# Patient Record
Sex: Female | Born: 1950 | Race: White | Hispanic: No | Marital: Married | Smoking: Never smoker
Health system: Southern US, Community
[De-identification: ages and names within clinical notes are randomized; demographics above are authoritative.]

## PROBLEM LIST (undated history)

## (undated) DIAGNOSIS — E559 Vitamin D deficiency, unspecified: Secondary | ICD-10-CM

## (undated) DIAGNOSIS — I10 Essential (primary) hypertension: Secondary | ICD-10-CM

## (undated) DIAGNOSIS — G629 Polyneuropathy, unspecified: Secondary | ICD-10-CM

## (undated) DIAGNOSIS — Z9289 Personal history of other medical treatment: Secondary | ICD-10-CM

## (undated) DIAGNOSIS — Z8489 Family history of other specified conditions: Secondary | ICD-10-CM

## (undated) DIAGNOSIS — T148XXA Other injury of unspecified body region, initial encounter: Secondary | ICD-10-CM

## (undated) DIAGNOSIS — Z973 Presence of spectacles and contact lenses: Secondary | ICD-10-CM

## (undated) DIAGNOSIS — E785 Hyperlipidemia, unspecified: Secondary | ICD-10-CM

## (undated) DIAGNOSIS — E119 Type 2 diabetes mellitus without complications: Secondary | ICD-10-CM

## (undated) HISTORY — DX: Vitamin D deficiency, unspecified: E55.9

## (undated) HISTORY — DX: Type 2 diabetes mellitus without complications: E11.9

## (undated) HISTORY — DX: Hyperlipidemia, unspecified: E78.5

## (undated) HISTORY — DX: Polyneuropathy, unspecified: G62.9

## (undated) HISTORY — PX: OTHER SURGICAL HISTORY: SHX169

## (undated) HISTORY — PX: TONSILLECTOMY: SUR1361

---

## 1974-06-05 HISTORY — PX: TUBAL LIGATION: SHX77

## 2011-12-19 ENCOUNTER — Other Ambulatory Visit: Payer: Self-pay | Admitting: Internal Medicine

## 2011-12-19 ENCOUNTER — Encounter: Payer: Self-pay | Admitting: Gastroenterology

## 2011-12-19 ENCOUNTER — Ambulatory Visit (INDEPENDENT_AMBULATORY_CARE_PROVIDER_SITE_OTHER): Payer: BC Managed Care – PPO | Admitting: Gastroenterology

## 2011-12-19 VITALS — BP 150/90 | HR 80 | Temp 97.2°F | Ht 62.0 in | Wt 172.8 lb

## 2011-12-19 DIAGNOSIS — Z1211 Encounter for screening for malignant neoplasm of colon: Secondary | ICD-10-CM

## 2011-12-19 DIAGNOSIS — E119 Type 2 diabetes mellitus without complications: Secondary | ICD-10-CM | POA: Insufficient documentation

## 2011-12-19 MED ORDER — PEG-KCL-NACL-NASULF-NA ASC-C 100 G PO SOLR: 1.0000 | Freq: Once | ORAL | Status: DC

## 2011-12-19 NOTE — Assessment & Plan Note (Addendum)
No prior colonoscopy. No family history of colon cancer or colon polyps her knowledge.  Colonoscopy in the near future with Dr. Gala Romney.  I have discussed the risks, alternatives, benefits with regards to but not limited to the risk of reaction to medication, bleeding, infection, perforation and the patient is agreeable to proceed. Written consent to be obtained.  Day of bowel prep, she will take glipizide one tablet in the morning. Metformin 1 tablet twice a day, Lantus 6 units in the morning. Morning of procedure, do not take any of your diabetes medications.  Patient wants to be completely sedation for the procedure. Does not want to be aware of anything.

## 2011-12-19 NOTE — Patient Instructions (Signed)
Colonoscopy in the near future with Dr. Gala Romney. Please see separate instructions.  Day of bowel prep, she will take glipizide one tablet in the morning. Metformin 1 tablet twice a day, Lantus 6 units in the morning. Morning of procedure, do not take any of your diabetes medications.

## 2011-12-19 NOTE — Progress Notes (Signed)
Faxed to PCP, [name]

## 2011-12-19 NOTE — Progress Notes (Signed)
Primary Care Physician:  [name], DO  Primary Gastroenterologist:  Garfield Cornea, MD   Chief Complaint  Patient presents with  . Colonoscopy    HPI:  Sarah Bridges is a 61 y.o. female here To schedule a first ever screening colonoscopy. Patient denies family history of colon cancer or colon polyps. Generally she has no problems with her bowel movements. She has occasional loose stool related to her medications. Takes occasional Imodium with good relief. No melena, brbpr. No n/v. No dysphagia, GERD. No unintentional weight loss.  Current Outpatient Prescriptions  Medication Sig Dispense Refill  . glipiZIDE (GLUCOTROL) 5 MG tablet Take 5 mg by mouth 2 (two) times daily before a meal.       . hydrochlorothiazide (HYDRODIURIL) 25 MG tablet Take 25 mg by mouth daily.       . insulin glargine (LANTUS) 100 UNIT/ML injection Inject 12 Units into the skin daily.       . metFORMIN (GLUCOPHAGE) 500 MG tablet Take 1,000 mg by mouth 2 (two) times daily with a meal.         Allergies as of 12/19/2011  . (No Known Allergies)    Past Medical History  Diagnosis Date  . DM (diabetes mellitus)   . Hyperlipidemia   . Peripheral neuropathy   . Vitamin d deficiency     Past Surgical History  Procedure Date  . Tonsillectomy   . Tubal ligation 1976  . Piloninal cyst     age 79    Family History  Problem Relation Age of Onset  . Emphysema Mother     deceased  . Liver disease Neg Hx   . Colon cancer Neg Hx     History   Social History  . Marital Status: Married    Spouse Name: N/A    Number of Children: 2  . Years of Education: N/A   Occupational History  . Control and instrumentation engineer    Social History Main Topics  . Smoking status: Never Smoker   . Smokeless tobacco: Not on file  . Alcohol Use: No  . Drug Use: No  . Sexually Active: Not on file   Other Topics Concern  . Not on file   Social History Narrative  . No narrative on file      ROS:  General: Negative for  anorexia, weight loss, fever, chills, fatigue, weakness. Eyes: Negative for vision changes.  ENT: Negative for hoarseness, difficulty swallowing , nasal congestion. CV: Negative for chest pain, angina, palpitations, dyspnea on exertion, peripheral edema.  Respiratory: Negative for dyspnea at rest, dyspnea on exertion, cough, sputum, wheezing.  GI: See history of present illness. GU:  Negative for dysuria, hematuria, urinary incontinence, urinary frequency, nocturnal urination.  MS: Negative for joint pain, low back pain.  Derm: Negative for rash or itching.  Neuro: Negative for weakness, abnormal sensation, seizure, frequent headaches, memory loss, confusion.  Psych: Negative for anxiety, depression, suicidal ideation, hallucinations.  Endo: Negative for unusual weight change.  Heme: Negative for bruising or bleeding. Allergy: Negative for rash or hives.    Physical Examination:  BP 150/90  Pulse 80  Temp 97.2 F (36.2 C) (Temporal)  Ht 5\' 2"  (1.575 m)  Wt 172 lb 12.8 oz (78.382 kg)  BMI 31.61 kg/m2   General: Well-nourished, well-developed in no acute distress.  Head: Normocephalic, atraumatic.   Eyes: Conjunctiva pink, no icterus. Mouth: Oropharyngeal mucosa moist and pink , no lesions erythema or exudate. Neck: Supple without thyromegaly, masses, or lymphadenopathy.  Lungs: Clear to auscultation bilaterally.  Heart: Regular rate and rhythm, no murmurs rubs or gallops.  Abdomen: Bowel sounds are normal, nontender, nondistended, no hepatosplenomegaly or masses, no abdominal bruits or    hernia , no rebound or guarding.   Rectal: deferred to the time of colonoscopy Extremities: No lower extremity edema. No clubbing or deformities.  Neuro: Alert and oriented x 4 , grossly normal neurologically.  Skin: Warm and dry, no rash or jaundice.   Psych: Alert and cooperative, normal mood and affect.

## 2012-01-03 ENCOUNTER — Encounter (HOSPITAL_COMMUNITY): Payer: Self-pay | Admitting: Pharmacy Technician

## 2012-01-17 ENCOUNTER — Encounter (HOSPITAL_COMMUNITY): Payer: Self-pay | Admitting: *Deleted

## 2012-01-17 ENCOUNTER — Ambulatory Visit (HOSPITAL_COMMUNITY)
Admission: RE | Admit: 2012-01-17 | Discharge: 2012-01-17 | Disposition: A | Discharge status: Home / Self Care | Payer: BC Managed Care – PPO | Source: Ambulatory Visit | Attending: Internal Medicine | Admitting: Internal Medicine

## 2012-01-17 ENCOUNTER — Encounter (HOSPITAL_COMMUNITY)
Admission: RE | Disposition: A | Discharge status: Home / Self Care | Payer: Self-pay | Source: Ambulatory Visit | Attending: Internal Medicine

## 2012-01-17 DIAGNOSIS — E119 Type 2 diabetes mellitus without complications: Secondary | ICD-10-CM | POA: Insufficient documentation

## 2012-01-17 DIAGNOSIS — D126 Benign neoplasm of colon, unspecified: Secondary | ICD-10-CM | POA: Insufficient documentation

## 2012-01-17 DIAGNOSIS — Z01812 Encounter for preprocedural laboratory examination: Secondary | ICD-10-CM | POA: Insufficient documentation

## 2012-01-17 DIAGNOSIS — K648 Other hemorrhoids: Secondary | ICD-10-CM | POA: Insufficient documentation

## 2012-01-17 DIAGNOSIS — E785 Hyperlipidemia, unspecified: Secondary | ICD-10-CM | POA: Insufficient documentation

## 2012-01-17 DIAGNOSIS — Z1211 Encounter for screening for malignant neoplasm of colon: Secondary | ICD-10-CM | POA: Insufficient documentation

## 2012-01-17 HISTORY — PX: COLONOSCOPY: SHX5424

## 2012-01-17 SURGERY — COLONOSCOPY
Anesthesia: Moderate Sedation

## 2012-01-17 MED ORDER — MEPERIDINE HCL 100 MG/ML IJ SOLN
INTRAMUSCULAR | Status: AC
  Filled 2012-01-17: qty 2

## 2012-01-17 MED ORDER — MIDAZOLAM HCL 5 MG/5ML IJ SOLN
INTRAMUSCULAR | Status: DC | PRN
  Administered 2012-01-17: 2 mg via INTRAVENOUS
  Administered 2012-01-17: 1 mg via INTRAVENOUS
  Administered 2012-01-17: 2 mg via INTRAVENOUS

## 2012-01-17 MED ORDER — STERILE WATER FOR IRRIGATION IR SOLN
Status: DC | PRN
  Administered 2012-01-17: 08:00:00

## 2012-01-17 MED ORDER — MIDAZOLAM HCL 5 MG/5ML IJ SOLN
INTRAMUSCULAR | Status: AC
  Filled 2012-01-17: qty 10

## 2012-01-17 MED ORDER — SODIUM CHLORIDE 0.45 % IV SOLN
Freq: Once | INTRAVENOUS | Status: AC
  Administered 2012-01-17: 1000 mL via INTRAVENOUS

## 2012-01-17 MED ORDER — MEPERIDINE HCL 100 MG/ML IJ SOLN
INTRAMUSCULAR | Status: DC | PRN
  Administered 2012-01-17 (×2): 50 mg via INTRAVENOUS
  Administered 2012-01-17: 25 mg via INTRAVENOUS

## 2012-01-17 NOTE — Op Note (Signed)
[facility] / [facility] [address] [place], [place]  [address]  COLONOSCOPY PROCEDURE REPORT  PATIENT:  Sarah, Bridges  MR#:  [id number] BIRTHDATE:  1950-06-23, 61 yrs. old  GENDER:  female ENDOSCOPIST:  R. Garfield Cornea, MD FACP [facility] REF. BY:                       Dr. Julio Sicks PROCEDURE DATE:  01/17/2012 PROCEDURE:  Colonoscopy  with snare polypectomy  INDICATIONS:  First ever average risk screening colonoscopy.  INFORMED CONSENT:  The risks, benefits, alternatives and imponderables including but not limited to bleeding, perforation as well as the possibility of a missed lesion have been reviewed. The potential for biopsy, lesion removal, etc. have also been discussed.  Questions have been answered.  All parties agreeable. Please see the history and physical in the medical record for more information.  MEDICATIONS:  Versed 5 mg IV and Demerol 125 mg IV in divided doses.  DESCRIPTION OF PROCEDURE:  After a digital rectal exam was performed, the  colonoscope was advanced from the anus through the rectum and colon to the area of the cecum, ileocecal valve and appendiceal orifice.  The cecum was deeply intubated.  These structures were well-seen and photographed for the record.  From the level of the cecum and ileocecal valve, the scope was slowly and cautiously withdrawn.  The mucosal surfaces were carefully surveyed utilizing scope tip deflection to facilitate fold flattening as needed.  The scope was pulled down into the rectum where a thorough examination including retroflexion was performed.  FINDINGS:  adequate preparation. Minimal internal hemorrhoids and anal papilla; otherwise, normal rectum. Somewhat elongated and tortuous colon. The patient had a  single 8 mm pedunculated polyp at the splenic flexure; the remainder of colonic mucosa appeared normal.  THERAPEUTIC / DIAGNOSTIC MANEUVERS PERFORMED:  the above-mentioned polyp was hot snared removed and recovered  for the pathologist  COMPLICATIONS:  None ( Endoscopy processor failure precluded retrieval of the multiple endoscopic photos taken during this procedure).  CECAL WITHDRAWAL TIME:   14 minutes  IMPRESSION:  Colonic polyp-removed as described above. Internal hemorrhoids and anal papilla. Somewhat tortuous and elongated colon  RECOMMENDATIONS:  Followup on pathology.  ______________________________ R. Garfield Cornea, MD [name]  CC:  [name],  n. eSIGNED:   R. Garfield Cornea at 01/17/2012 09:33 AM  Darrin Nipper, [id number]

## 2012-01-17 NOTE — H&P (View-Only) (Signed)
Primary Care Physician:  [name], DO  Primary Gastroenterologist:  Garfield Cornea, MD   Chief Complaint  Patient presents with  . Colonoscopy    HPI:  Sarah Bridges is a 61 y.o. female here To schedule a first ever screening colonoscopy. Patient denies family history of colon cancer or colon polyps. Generally she has no problems with her bowel movements. She has occasional loose stool related to her medications. Takes occasional Imodium with good relief. No melena, brbpr. No n/v. No dysphagia, GERD. No unintentional weight loss.  Current Outpatient Prescriptions  Medication Sig Dispense Refill  . glipiZIDE (GLUCOTROL) 5 MG tablet Take 5 mg by mouth 2 (two) times daily before a meal.       . hydrochlorothiazide (HYDRODIURIL) 25 MG tablet Take 25 mg by mouth daily.       . insulin glargine (LANTUS) 100 UNIT/ML injection Inject 12 Units into the skin daily.       . metFORMIN (GLUCOPHAGE) 500 MG tablet Take 1,000 mg by mouth 2 (two) times daily with a meal.         Allergies as of 12/19/2011  . (No Known Allergies)    Past Medical History  Diagnosis Date  . DM (diabetes mellitus)   . Hyperlipidemia   . Peripheral neuropathy   . Vitamin d deficiency     Past Surgical History  Procedure Date  . Tonsillectomy   . Tubal ligation 1976  . Piloninal cyst     age 60    Family History  Problem Relation Age of Onset  . Emphysema Mother     deceased  . Liver disease Neg Hx   . Colon cancer Neg Hx     History   Social History  . Marital Status: Married    Spouse Name: N/A    Number of Children: 2  . Years of Education: N/A   Occupational History  . Control and instrumentation engineer    Social History Main Topics  . Smoking status: Never Smoker   . Smokeless tobacco: Not on file  . Alcohol Use: No  . Drug Use: No  . Sexually Active: Not on file   Other Topics Concern  . Not on file   Social History Narrative  . No narrative on file      ROS:  General: Negative for  anorexia, weight loss, fever, chills, fatigue, weakness. Eyes: Negative for vision changes.  ENT: Negative for hoarseness, difficulty swallowing , nasal congestion. CV: Negative for chest pain, angina, palpitations, dyspnea on exertion, peripheral edema.  Respiratory: Negative for dyspnea at rest, dyspnea on exertion, cough, sputum, wheezing.  GI: See history of present illness. GU:  Negative for dysuria, hematuria, urinary incontinence, urinary frequency, nocturnal urination.  MS: Negative for joint pain, low back pain.  Derm: Negative for rash or itching.  Neuro: Negative for weakness, abnormal sensation, seizure, frequent headaches, memory loss, confusion.  Psych: Negative for anxiety, depression, suicidal ideation, hallucinations.  Endo: Negative for unusual weight change.  Heme: Negative for bruising or bleeding. Allergy: Negative for rash or hives.    Physical Examination:  BP 150/90  Pulse 80  Temp 97.2 F (36.2 C) (Temporal)  Ht 5\' 2"  (1.575 m)  Wt 172 lb 12.8 oz (78.382 kg)  BMI 31.61 kg/m2   General: Well-nourished, well-developed in no acute distress.  Head: Normocephalic, atraumatic.   Eyes: Conjunctiva pink, no icterus. Mouth: Oropharyngeal mucosa moist and pink , no lesions erythema or exudate. Neck: Supple without thyromegaly, masses, or lymphadenopathy.  Lungs: Clear to auscultation bilaterally.  Heart: Regular rate and rhythm, no murmurs rubs or gallops.  Abdomen: Bowel sounds are normal, nontender, nondistended, no hepatosplenomegaly or masses, no abdominal bruits or    hernia , no rebound or guarding.   Rectal: deferred to the time of colonoscopy Extremities: No lower extremity edema. No clubbing or deformities.  Neuro: Alert and oriented x 4 , grossly normal neurologically.  Skin: Warm and dry, no rash or jaundice.   Psych: Alert and cooperative, normal mood and affect.

## 2012-01-17 NOTE — Interval H&P Note (Signed)
History and Physical Interval Note:  01/17/2012 8:51 AM  Sarah Bridges  has presented today for surgery, with the diagnosis of screening colonoscopy  The various methods of treatment have been discussed with the patient and family. After consideration of risks, benefits and other options for treatment, the patient has consented to  Procedure(s) (LRB): COLONOSCOPY (N/A) as a surgical intervention .  The patient's history has been reviewed, patient examined, no change in status, stable for surgery.  I have reviewed the patient's chart and labs.  Questions were answered to the patient's satisfaction.     Manus Rudd

## 2012-01-19 ENCOUNTER — Encounter: Payer: Self-pay | Admitting: Internal Medicine

## 2012-01-23 ENCOUNTER — Encounter (HOSPITAL_COMMUNITY): Payer: Self-pay | Admitting: Internal Medicine

## 2012-12-04 ENCOUNTER — Ambulatory Visit (INDEPENDENT_AMBULATORY_CARE_PROVIDER_SITE_OTHER): Payer: Self-pay | Admitting: Ophthalmology

## 2012-12-12 ENCOUNTER — Encounter (INDEPENDENT_AMBULATORY_CARE_PROVIDER_SITE_OTHER): Payer: Self-pay | Admitting: Ophthalmology

## 2012-12-20 ENCOUNTER — Encounter (INDEPENDENT_AMBULATORY_CARE_PROVIDER_SITE_OTHER): Payer: BC Managed Care – PPO | Admitting: Ophthalmology

## 2012-12-20 DIAGNOSIS — H35039 Hypertensive retinopathy, unspecified eye: Secondary | ICD-10-CM

## 2012-12-20 DIAGNOSIS — E1139 Type 2 diabetes mellitus with other diabetic ophthalmic complication: Secondary | ICD-10-CM

## 2012-12-20 DIAGNOSIS — E11359 Type 2 diabetes mellitus with proliferative diabetic retinopathy without macular edema: Secondary | ICD-10-CM

## 2012-12-20 DIAGNOSIS — E11311 Type 2 diabetes mellitus with unspecified diabetic retinopathy with macular edema: Secondary | ICD-10-CM

## 2012-12-20 DIAGNOSIS — I10 Essential (primary) hypertension: Secondary | ICD-10-CM

## 2012-12-20 DIAGNOSIS — E11319 Type 2 diabetes mellitus with unspecified diabetic retinopathy without macular edema: Secondary | ICD-10-CM

## 2012-12-20 DIAGNOSIS — H43819 Vitreous degeneration, unspecified eye: Secondary | ICD-10-CM

## 2012-12-26 ENCOUNTER — Encounter (INDEPENDENT_AMBULATORY_CARE_PROVIDER_SITE_OTHER): Payer: Self-pay | Admitting: Ophthalmology

## 2013-01-15 ENCOUNTER — Encounter (INDEPENDENT_AMBULATORY_CARE_PROVIDER_SITE_OTHER): Payer: BC Managed Care – PPO | Admitting: Ophthalmology

## 2013-01-15 DIAGNOSIS — I10 Essential (primary) hypertension: Secondary | ICD-10-CM

## 2013-01-15 DIAGNOSIS — E1139 Type 2 diabetes mellitus with other diabetic ophthalmic complication: Secondary | ICD-10-CM

## 2013-01-15 DIAGNOSIS — E11319 Type 2 diabetes mellitus with unspecified diabetic retinopathy without macular edema: Secondary | ICD-10-CM

## 2013-01-15 DIAGNOSIS — E11359 Type 2 diabetes mellitus with proliferative diabetic retinopathy without macular edema: Secondary | ICD-10-CM

## 2013-01-15 DIAGNOSIS — H35039 Hypertensive retinopathy, unspecified eye: Secondary | ICD-10-CM

## 2013-01-15 DIAGNOSIS — E11311 Type 2 diabetes mellitus with unspecified diabetic retinopathy with macular edema: Secondary | ICD-10-CM

## 2013-01-20 ENCOUNTER — Encounter (INDEPENDENT_AMBULATORY_CARE_PROVIDER_SITE_OTHER): Payer: BC Managed Care – PPO | Admitting: Ophthalmology

## 2013-02-12 ENCOUNTER — Encounter (INDEPENDENT_AMBULATORY_CARE_PROVIDER_SITE_OTHER): Payer: BC Managed Care – PPO | Admitting: Ophthalmology

## 2013-02-12 DIAGNOSIS — E11359 Type 2 diabetes mellitus with proliferative diabetic retinopathy without macular edema: Secondary | ICD-10-CM

## 2013-02-12 DIAGNOSIS — H35039 Hypertensive retinopathy, unspecified eye: Secondary | ICD-10-CM

## 2013-02-12 DIAGNOSIS — I10 Essential (primary) hypertension: Secondary | ICD-10-CM

## 2013-02-12 DIAGNOSIS — E1139 Type 2 diabetes mellitus with other diabetic ophthalmic complication: Secondary | ICD-10-CM

## 2013-02-12 DIAGNOSIS — E11319 Type 2 diabetes mellitus with unspecified diabetic retinopathy without macular edema: Secondary | ICD-10-CM

## 2013-02-12 DIAGNOSIS — E11311 Type 2 diabetes mellitus with unspecified diabetic retinopathy with macular edema: Secondary | ICD-10-CM

## 2013-02-12 DIAGNOSIS — D3130 Benign neoplasm of unspecified choroid: Secondary | ICD-10-CM

## 2013-03-12 ENCOUNTER — Encounter (INDEPENDENT_AMBULATORY_CARE_PROVIDER_SITE_OTHER): Payer: BC Managed Care – PPO | Admitting: Ophthalmology

## 2013-03-19 ENCOUNTER — Encounter (INDEPENDENT_AMBULATORY_CARE_PROVIDER_SITE_OTHER): Payer: BC Managed Care – PPO | Admitting: Ophthalmology

## 2013-03-19 DIAGNOSIS — H35039 Hypertensive retinopathy, unspecified eye: Secondary | ICD-10-CM

## 2013-03-19 DIAGNOSIS — I10 Essential (primary) hypertension: Secondary | ICD-10-CM

## 2013-03-19 DIAGNOSIS — E1139 Type 2 diabetes mellitus with other diabetic ophthalmic complication: Secondary | ICD-10-CM

## 2013-03-19 DIAGNOSIS — H43819 Vitreous degeneration, unspecified eye: Secondary | ICD-10-CM

## 2013-03-19 DIAGNOSIS — H2510 Age-related nuclear cataract, unspecified eye: Secondary | ICD-10-CM

## 2013-03-19 DIAGNOSIS — E11319 Type 2 diabetes mellitus with unspecified diabetic retinopathy without macular edema: Secondary | ICD-10-CM

## 2013-03-19 DIAGNOSIS — E11311 Type 2 diabetes mellitus with unspecified diabetic retinopathy with macular edema: Secondary | ICD-10-CM

## 2013-03-19 DIAGNOSIS — E11359 Type 2 diabetes mellitus with proliferative diabetic retinopathy without macular edema: Secondary | ICD-10-CM

## 2013-04-09 ENCOUNTER — Other Ambulatory Visit (INDEPENDENT_AMBULATORY_CARE_PROVIDER_SITE_OTHER): Payer: BC Managed Care – PPO | Admitting: Ophthalmology

## 2013-04-09 DIAGNOSIS — H3581 Retinal edema: Secondary | ICD-10-CM

## 2013-04-09 DIAGNOSIS — E1139 Type 2 diabetes mellitus with other diabetic ophthalmic complication: Secondary | ICD-10-CM

## 2013-04-30 ENCOUNTER — Encounter (INDEPENDENT_AMBULATORY_CARE_PROVIDER_SITE_OTHER): Payer: BC Managed Care – PPO | Admitting: Ophthalmology

## 2013-04-30 DIAGNOSIS — E11359 Type 2 diabetes mellitus with proliferative diabetic retinopathy without macular edema: Secondary | ICD-10-CM

## 2013-04-30 DIAGNOSIS — H2510 Age-related nuclear cataract, unspecified eye: Secondary | ICD-10-CM

## 2013-04-30 DIAGNOSIS — E1139 Type 2 diabetes mellitus with other diabetic ophthalmic complication: Secondary | ICD-10-CM

## 2013-04-30 DIAGNOSIS — I10 Essential (primary) hypertension: Secondary | ICD-10-CM

## 2013-04-30 DIAGNOSIS — E11319 Type 2 diabetes mellitus with unspecified diabetic retinopathy without macular edema: Secondary | ICD-10-CM

## 2013-04-30 DIAGNOSIS — H35039 Hypertensive retinopathy, unspecified eye: Secondary | ICD-10-CM

## 2013-04-30 DIAGNOSIS — D3130 Benign neoplasm of unspecified choroid: Secondary | ICD-10-CM

## 2013-04-30 DIAGNOSIS — H43819 Vitreous degeneration, unspecified eye: Secondary | ICD-10-CM

## 2013-04-30 DIAGNOSIS — E11311 Type 2 diabetes mellitus with unspecified diabetic retinopathy with macular edema: Secondary | ICD-10-CM

## 2013-05-05 ENCOUNTER — Encounter (INDEPENDENT_AMBULATORY_CARE_PROVIDER_SITE_OTHER): Payer: BC Managed Care – PPO | Admitting: Ophthalmology

## 2013-05-21 ENCOUNTER — Other Ambulatory Visit (INDEPENDENT_AMBULATORY_CARE_PROVIDER_SITE_OTHER): Payer: BC Managed Care – PPO | Admitting: Ophthalmology

## 2013-06-11 ENCOUNTER — Encounter (INDEPENDENT_AMBULATORY_CARE_PROVIDER_SITE_OTHER): Payer: BC Managed Care – PPO | Admitting: Ophthalmology

## 2013-06-11 DIAGNOSIS — H2510 Age-related nuclear cataract, unspecified eye: Secondary | ICD-10-CM

## 2013-06-11 DIAGNOSIS — H43819 Vitreous degeneration, unspecified eye: Secondary | ICD-10-CM

## 2013-06-11 DIAGNOSIS — E1165 Type 2 diabetes mellitus with hyperglycemia: Secondary | ICD-10-CM

## 2013-06-11 DIAGNOSIS — H35039 Hypertensive retinopathy, unspecified eye: Secondary | ICD-10-CM

## 2013-06-11 DIAGNOSIS — D3130 Benign neoplasm of unspecified choroid: Secondary | ICD-10-CM

## 2013-06-11 DIAGNOSIS — E11359 Type 2 diabetes mellitus with proliferative diabetic retinopathy without macular edema: Secondary | ICD-10-CM

## 2013-06-11 DIAGNOSIS — I10 Essential (primary) hypertension: Secondary | ICD-10-CM

## 2013-06-11 DIAGNOSIS — E11311 Type 2 diabetes mellitus with unspecified diabetic retinopathy with macular edema: Secondary | ICD-10-CM

## 2013-06-11 DIAGNOSIS — E11319 Type 2 diabetes mellitus with unspecified diabetic retinopathy without macular edema: Secondary | ICD-10-CM

## 2013-06-11 DIAGNOSIS — E1139 Type 2 diabetes mellitus with other diabetic ophthalmic complication: Secondary | ICD-10-CM

## 2013-08-05 ENCOUNTER — Encounter (INDEPENDENT_AMBULATORY_CARE_PROVIDER_SITE_OTHER): Payer: BC Managed Care – PPO | Admitting: Ophthalmology

## 2013-08-05 DIAGNOSIS — H35039 Hypertensive retinopathy, unspecified eye: Secondary | ICD-10-CM

## 2013-08-05 DIAGNOSIS — E11319 Type 2 diabetes mellitus with unspecified diabetic retinopathy without macular edema: Secondary | ICD-10-CM

## 2013-08-05 DIAGNOSIS — E11359 Type 2 diabetes mellitus with proliferative diabetic retinopathy without macular edema: Secondary | ICD-10-CM

## 2013-08-05 DIAGNOSIS — E1165 Type 2 diabetes mellitus with hyperglycemia: Secondary | ICD-10-CM

## 2013-08-05 DIAGNOSIS — E11311 Type 2 diabetes mellitus with unspecified diabetic retinopathy with macular edema: Secondary | ICD-10-CM

## 2013-08-05 DIAGNOSIS — H43819 Vitreous degeneration, unspecified eye: Secondary | ICD-10-CM

## 2013-08-05 DIAGNOSIS — I10 Essential (primary) hypertension: Secondary | ICD-10-CM

## 2013-08-05 DIAGNOSIS — E1139 Type 2 diabetes mellitus with other diabetic ophthalmic complication: Secondary | ICD-10-CM

## 2013-08-06 ENCOUNTER — Encounter (INDEPENDENT_AMBULATORY_CARE_PROVIDER_SITE_OTHER): Payer: BC Managed Care – PPO | Admitting: Ophthalmology

## 2013-10-01 ENCOUNTER — Encounter (INDEPENDENT_AMBULATORY_CARE_PROVIDER_SITE_OTHER): Payer: BC Managed Care – PPO | Admitting: Ophthalmology

## 2013-10-01 DIAGNOSIS — H35039 Hypertensive retinopathy, unspecified eye: Secondary | ICD-10-CM

## 2013-10-01 DIAGNOSIS — H43819 Vitreous degeneration, unspecified eye: Secondary | ICD-10-CM

## 2013-10-01 DIAGNOSIS — H2510 Age-related nuclear cataract, unspecified eye: Secondary | ICD-10-CM

## 2013-10-01 DIAGNOSIS — E1139 Type 2 diabetes mellitus with other diabetic ophthalmic complication: Secondary | ICD-10-CM

## 2013-10-01 DIAGNOSIS — I10 Essential (primary) hypertension: Secondary | ICD-10-CM

## 2013-10-01 DIAGNOSIS — E11311 Type 2 diabetes mellitus with unspecified diabetic retinopathy with macular edema: Secondary | ICD-10-CM

## 2013-10-01 DIAGNOSIS — E11319 Type 2 diabetes mellitus with unspecified diabetic retinopathy without macular edema: Secondary | ICD-10-CM

## 2013-10-01 DIAGNOSIS — E1165 Type 2 diabetes mellitus with hyperglycemia: Secondary | ICD-10-CM

## 2013-10-01 DIAGNOSIS — D3130 Benign neoplasm of unspecified choroid: Secondary | ICD-10-CM

## 2013-11-26 ENCOUNTER — Encounter (INDEPENDENT_AMBULATORY_CARE_PROVIDER_SITE_OTHER): Payer: BC Managed Care – PPO | Admitting: Ophthalmology

## 2013-11-27 ENCOUNTER — Encounter (INDEPENDENT_AMBULATORY_CARE_PROVIDER_SITE_OTHER): Payer: BC Managed Care – PPO | Admitting: Ophthalmology

## 2013-11-27 DIAGNOSIS — H35039 Hypertensive retinopathy, unspecified eye: Secondary | ICD-10-CM

## 2013-11-27 DIAGNOSIS — H43819 Vitreous degeneration, unspecified eye: Secondary | ICD-10-CM

## 2013-11-27 DIAGNOSIS — E1165 Type 2 diabetes mellitus with hyperglycemia: Secondary | ICD-10-CM

## 2013-11-27 DIAGNOSIS — E11311 Type 2 diabetes mellitus with unspecified diabetic retinopathy with macular edema: Secondary | ICD-10-CM

## 2013-11-27 DIAGNOSIS — I10 Essential (primary) hypertension: Secondary | ICD-10-CM

## 2013-11-27 DIAGNOSIS — E1139 Type 2 diabetes mellitus with other diabetic ophthalmic complication: Secondary | ICD-10-CM

## 2013-11-27 DIAGNOSIS — E11319 Type 2 diabetes mellitus with unspecified diabetic retinopathy without macular edema: Secondary | ICD-10-CM

## 2014-01-22 ENCOUNTER — Encounter (INDEPENDENT_AMBULATORY_CARE_PROVIDER_SITE_OTHER): Payer: BC Managed Care – PPO | Admitting: Ophthalmology

## 2014-01-26 ENCOUNTER — Encounter (INDEPENDENT_AMBULATORY_CARE_PROVIDER_SITE_OTHER): Payer: BC Managed Care – PPO | Admitting: Ophthalmology

## 2014-01-26 DIAGNOSIS — E1139 Type 2 diabetes mellitus with other diabetic ophthalmic complication: Secondary | ICD-10-CM

## 2014-01-26 DIAGNOSIS — E11359 Type 2 diabetes mellitus with proliferative diabetic retinopathy without macular edema: Secondary | ICD-10-CM

## 2014-01-26 DIAGNOSIS — H35039 Hypertensive retinopathy, unspecified eye: Secondary | ICD-10-CM

## 2014-01-26 DIAGNOSIS — I10 Essential (primary) hypertension: Secondary | ICD-10-CM

## 2014-01-26 DIAGNOSIS — E11319 Type 2 diabetes mellitus with unspecified diabetic retinopathy without macular edema: Secondary | ICD-10-CM

## 2014-01-26 DIAGNOSIS — E11311 Type 2 diabetes mellitus with unspecified diabetic retinopathy with macular edema: Secondary | ICD-10-CM

## 2014-01-26 DIAGNOSIS — E1165 Type 2 diabetes mellitus with hyperglycemia: Secondary | ICD-10-CM

## 2014-03-23 ENCOUNTER — Encounter (INDEPENDENT_AMBULATORY_CARE_PROVIDER_SITE_OTHER): Payer: BC Managed Care – PPO | Admitting: Ophthalmology

## 2014-03-23 DIAGNOSIS — E11311 Type 2 diabetes mellitus with unspecified diabetic retinopathy with macular edema: Secondary | ICD-10-CM

## 2014-03-23 DIAGNOSIS — E11331 Type 2 diabetes mellitus with moderate nonproliferative diabetic retinopathy with macular edema: Secondary | ICD-10-CM

## 2014-03-23 DIAGNOSIS — E11351 Type 2 diabetes mellitus with proliferative diabetic retinopathy with macular edema: Secondary | ICD-10-CM

## 2014-03-23 DIAGNOSIS — H35033 Hypertensive retinopathy, bilateral: Secondary | ICD-10-CM

## 2014-03-23 DIAGNOSIS — I10 Essential (primary) hypertension: Secondary | ICD-10-CM

## 2014-03-23 DIAGNOSIS — H43813 Vitreous degeneration, bilateral: Secondary | ICD-10-CM

## 2014-03-23 DIAGNOSIS — D3131 Benign neoplasm of right choroid: Secondary | ICD-10-CM

## 2014-05-18 ENCOUNTER — Encounter (INDEPENDENT_AMBULATORY_CARE_PROVIDER_SITE_OTHER): Payer: BC Managed Care – PPO | Admitting: Ophthalmology

## 2014-05-18 DIAGNOSIS — E11339 Type 2 diabetes mellitus with moderate nonproliferative diabetic retinopathy without macular edema: Secondary | ICD-10-CM

## 2014-05-18 DIAGNOSIS — H43813 Vitreous degeneration, bilateral: Secondary | ICD-10-CM

## 2014-05-18 DIAGNOSIS — H2513 Age-related nuclear cataract, bilateral: Secondary | ICD-10-CM

## 2014-05-18 DIAGNOSIS — I10 Essential (primary) hypertension: Secondary | ICD-10-CM

## 2014-05-18 DIAGNOSIS — E11351 Type 2 diabetes mellitus with proliferative diabetic retinopathy with macular edema: Secondary | ICD-10-CM

## 2014-05-18 DIAGNOSIS — H35033 Hypertensive retinopathy, bilateral: Secondary | ICD-10-CM

## 2014-07-13 ENCOUNTER — Encounter (INDEPENDENT_AMBULATORY_CARE_PROVIDER_SITE_OTHER): Payer: BC Managed Care – PPO | Admitting: Ophthalmology

## 2014-07-13 DIAGNOSIS — E11351 Type 2 diabetes mellitus with proliferative diabetic retinopathy with macular edema: Secondary | ICD-10-CM

## 2014-07-13 DIAGNOSIS — I10 Essential (primary) hypertension: Secondary | ICD-10-CM

## 2014-07-13 DIAGNOSIS — H2513 Age-related nuclear cataract, bilateral: Secondary | ICD-10-CM

## 2014-07-13 DIAGNOSIS — E11311 Type 2 diabetes mellitus with unspecified diabetic retinopathy with macular edema: Secondary | ICD-10-CM

## 2014-07-13 DIAGNOSIS — H43813 Vitreous degeneration, bilateral: Secondary | ICD-10-CM

## 2014-07-13 DIAGNOSIS — H35033 Hypertensive retinopathy, bilateral: Secondary | ICD-10-CM

## 2014-07-13 DIAGNOSIS — E11331 Type 2 diabetes mellitus with moderate nonproliferative diabetic retinopathy with macular edema: Secondary | ICD-10-CM

## 2014-07-24 ENCOUNTER — Other Ambulatory Visit (HOSPITAL_COMMUNITY): Payer: Self-pay | Admitting: Orthopedic Surgery

## 2014-07-30 ENCOUNTER — Encounter (HOSPITAL_COMMUNITY): Payer: Self-pay | Admitting: *Deleted

## 2014-07-30 MED ORDER — CEFAZOLIN SODIUM-DEXTROSE 2-3 GM-% IV SOLR
2.0000 g | INTRAVENOUS | Status: AC
  Administered 2014-07-31: 2 g via INTRAVENOUS
  Filled 2014-07-30: qty 50

## 2014-07-30 NOTE — Progress Notes (Signed)
Pt denies SOB, chest pain, and being under the care of a cardiologist. Pt denies having a chest x ray and EKG within the last year. Pt denies having a stress test, echo and cardiac cath. Pt made aware to stop taking Aspirin, vitamins and herbal medications. Do not take any NSAIDs ie: Ibuprofen, Advil, Naproxen or any medication containing Aspirin.  Pt also made aware to not take any diabetic medication the morning of procedure ; pt verbalized understanding of instructions.

## 2014-07-31 ENCOUNTER — Encounter (HOSPITAL_COMMUNITY)
Admission: RE | Disposition: A | Discharge status: Home / Self Care | Payer: Self-pay | Source: Ambulatory Visit | Attending: Orthopedic Surgery

## 2014-07-31 ENCOUNTER — Ambulatory Visit (HOSPITAL_COMMUNITY): Payer: BC Managed Care – PPO | Admitting: Anesthesiology

## 2014-07-31 ENCOUNTER — Encounter (HOSPITAL_COMMUNITY): Payer: Self-pay | Admitting: *Deleted

## 2014-07-31 ENCOUNTER — Ambulatory Visit (HOSPITAL_COMMUNITY)
Admission: RE | Admit: 2014-07-31 | Discharge: 2014-07-31 | Disposition: A | Discharge status: Home / Self Care | Payer: BC Managed Care – PPO | Source: Ambulatory Visit | Attending: Orthopedic Surgery | Admitting: Orthopedic Surgery

## 2014-07-31 DIAGNOSIS — Z6833 Body mass index (BMI) 33.0-33.9, adult: Secondary | ICD-10-CM | POA: Insufficient documentation

## 2014-07-31 DIAGNOSIS — Y939 Activity, unspecified: Secondary | ICD-10-CM | POA: Diagnosis not present

## 2014-07-31 DIAGNOSIS — I10 Essential (primary) hypertension: Secondary | ICD-10-CM | POA: Diagnosis not present

## 2014-07-31 DIAGNOSIS — X58XXXA Exposure to other specified factors, initial encounter: Secondary | ICD-10-CM | POA: Diagnosis not present

## 2014-07-31 DIAGNOSIS — G629 Polyneuropathy, unspecified: Secondary | ICD-10-CM | POA: Insufficient documentation

## 2014-07-31 DIAGNOSIS — Y929 Unspecified place or not applicable: Secondary | ICD-10-CM | POA: Insufficient documentation

## 2014-07-31 DIAGNOSIS — E119 Type 2 diabetes mellitus without complications: Secondary | ICD-10-CM | POA: Diagnosis not present

## 2014-07-31 DIAGNOSIS — Y999 Unspecified external cause status: Secondary | ICD-10-CM | POA: Diagnosis not present

## 2014-07-31 DIAGNOSIS — E11610 Type 2 diabetes mellitus with diabetic neuropathic arthropathy: Secondary | ICD-10-CM

## 2014-07-31 DIAGNOSIS — E559 Vitamin D deficiency, unspecified: Secondary | ICD-10-CM | POA: Diagnosis not present

## 2014-07-31 DIAGNOSIS — S92251A Displaced fracture of navicular [scaphoid] of right foot, initial encounter for closed fracture: Secondary | ICD-10-CM | POA: Diagnosis present

## 2014-07-31 DIAGNOSIS — E785 Hyperlipidemia, unspecified: Secondary | ICD-10-CM | POA: Diagnosis not present

## 2014-07-31 HISTORY — DX: Presence of spectacles and contact lenses: Z97.3

## 2014-07-31 HISTORY — DX: Essential (primary) hypertension: I10

## 2014-07-31 HISTORY — PX: OPEN REDUCTION INTERNAL FIXATION (ORIF) FOOT LISFRANC FRACTURE: SHX5990

## 2014-07-31 HISTORY — DX: Other injury of unspecified body region, initial encounter: T14.8XXA

## 2014-07-31 LAB — CBC
HCT: 30.4 % — ABNORMAL LOW (ref 36.0–46.0)
HEMOGLOBIN: 10.6 g/dL — AB (ref 12.0–15.0)
MCH: 29.4 pg (ref 26.0–34.0)
MCHC: 34.9 g/dL (ref 30.0–36.0)
MCV: 84.4 fL (ref 78.0–100.0)
Platelets: 95 10*3/uL — ABNORMAL LOW (ref 150–400)
RBC: 3.6 MIL/uL — AB (ref 3.87–5.11)
RDW: 14.1 % (ref 11.5–15.5)
WBC: 5.6 10*3/uL (ref 4.0–10.5)

## 2014-07-31 LAB — COMPREHENSIVE METABOLIC PANEL
ALK PHOS: 72 U/L (ref 39–117)
ALT: 16 U/L (ref 0–35)
AST: 22 U/L (ref 0–37)
Albumin: 3.8 g/dL (ref 3.5–5.2)
Anion gap: 8 (ref 5–15)
BILIRUBIN TOTAL: 0.9 mg/dL (ref 0.3–1.2)
BUN: 26 mg/dL — ABNORMAL HIGH (ref 6–23)
CO2: 22 mmol/L (ref 19–32)
Calcium: 8.9 mg/dL (ref 8.4–10.5)
Chloride: 106 mmol/L (ref 96–112)
Creatinine, Ser: 1.27 mg/dL — ABNORMAL HIGH (ref 0.50–1.10)
GFR, EST AFRICAN AMERICAN: 51 mL/min — AB (ref >90)
GFR, EST NON AFRICAN AMERICAN: 44 mL/min — AB (ref >90)
GLUCOSE: 216 mg/dL — AB (ref 70–99)
Potassium: 4.8 mmol/L (ref 3.5–5.1)
SODIUM: 136 mmol/L (ref 135–145)
Total Protein: 6.7 g/dL (ref 6.0–8.3)

## 2014-07-31 LAB — APTT: aPTT: 28 seconds (ref 24–37)

## 2014-07-31 LAB — GLUCOSE, CAPILLARY
GLUCOSE-CAPILLARY: 213 mg/dL — AB (ref 70–99)
GLUCOSE-CAPILLARY: 153 mg/dL — AB (ref 70–99)

## 2014-07-31 LAB — PROTIME-INR
INR: 1.08 (ref 0.00–1.49)
Prothrombin Time: 14.2 seconds (ref 11.6–15.2)

## 2014-07-31 SURGERY — OPEN REDUCTION INTERNAL FIXATION (ORIF) FOOT LISFRANC FRACTURE
Anesthesia: Regional | Site: Foot | Laterality: Right

## 2014-07-31 MED ORDER — ONDANSETRON HCL 4 MG/2ML IJ SOLN
INTRAMUSCULAR | Status: DC | PRN
  Administered 2014-07-31: 4 mg via INTRAVENOUS

## 2014-07-31 MED ORDER — MIDAZOLAM HCL 5 MG/5ML IJ SOLN
INTRAMUSCULAR | Status: DC | PRN
  Administered 2014-07-31 (×2): 1 mg via INTRAVENOUS

## 2014-07-31 MED ORDER — FENTANYL CITRATE 0.05 MG/ML IJ SOLN: 25.0000 ug | INTRAMUSCULAR | Status: DC | PRN

## 2014-07-31 MED ORDER — FENTANYL CITRATE 0.05 MG/ML IJ SOLN
INTRAMUSCULAR | Status: AC
  Administered 2014-07-31: 50 ug
  Filled 2014-07-31: qty 2

## 2014-07-31 MED ORDER — OXYCODONE HCL 5 MG/5ML PO SOLN: 5.0000 mg | Freq: Once | ORAL | Status: DC | PRN

## 2014-07-31 MED ORDER — GLYCOPYRROLATE 0.2 MG/ML IJ SOLN
INTRAMUSCULAR | Status: AC
Start: 2014-07-31 — End: 2014-07-31
  Filled 2014-07-31: qty 1

## 2014-07-31 MED ORDER — HYDROCODONE-ACETAMINOPHEN 5-325 MG PO TABS: 1.0000 | ORAL_TABLET | ORAL | Status: DC | PRN

## 2014-07-31 MED ORDER — MIDAZOLAM HCL 2 MG/2ML IJ SOLN
INTRAMUSCULAR | Status: AC
  Filled 2014-07-31: qty 2

## 2014-07-31 MED ORDER — LIDOCAINE HCL (CARDIAC) 20 MG/ML IV SOLN
INTRAVENOUS | Status: DC | PRN
  Administered 2014-07-31: 20 mg via INTRAVENOUS

## 2014-07-31 MED ORDER — PROPOFOL 10 MG/ML IV BOLUS
INTRAVENOUS | Status: AC
  Filled 2014-07-31: qty 20

## 2014-07-31 MED ORDER — ACETAMINOPHEN 325 MG PO TABS: 325.0000 mg | ORAL_TABLET | ORAL | Status: DC | PRN

## 2014-07-31 MED ORDER — MIDAZOLAM HCL 2 MG/2ML IJ SOLN
INTRAMUSCULAR | Status: AC
  Administered 2014-07-31: 1 mg
  Filled 2014-07-31: qty 2

## 2014-07-31 MED ORDER — ACETAMINOPHEN 160 MG/5ML PO SOLN
325.0000 mg | ORAL | Status: DC | PRN
  Filled 2014-07-31: qty 20.3

## 2014-07-31 MED ORDER — 0.9 % SODIUM CHLORIDE (POUR BTL) OPTIME
TOPICAL | Status: DC | PRN
  Administered 2014-07-31: 1000 mL

## 2014-07-31 MED ORDER — DEXAMETHASONE SODIUM PHOSPHATE 10 MG/ML IJ SOLN
INTRAMUSCULAR | Status: AC
  Filled 2014-07-31: qty 1

## 2014-07-31 MED ORDER — ONDANSETRON HCL 4 MG/2ML IJ SOLN
INTRAMUSCULAR | Status: AC
  Filled 2014-07-31: qty 2

## 2014-07-31 MED ORDER — ROPIVACAINE HCL 5 MG/ML IJ SOLN
INTRAMUSCULAR | Status: DC | PRN
  Administered 2014-07-31: 30 mL via PERINEURAL

## 2014-07-31 MED ORDER — LACTATED RINGERS IV SOLN
INTRAVENOUS | Status: DC
  Administered 2014-07-31: 12:00:00 via INTRAVENOUS

## 2014-07-31 MED ORDER — FENTANYL CITRATE 0.05 MG/ML IJ SOLN
INTRAMUSCULAR | Status: AC
  Filled 2014-07-31: qty 5

## 2014-07-31 MED ORDER — PROPOFOL INFUSION 10 MG/ML OPTIME
INTRAVENOUS | Status: DC | PRN
  Administered 2014-07-31: 100 ug/kg/min via INTRAVENOUS

## 2014-07-31 MED ORDER — OXYCODONE HCL 5 MG PO TABS: 5.0000 mg | ORAL_TABLET | Freq: Once | ORAL | Status: DC | PRN

## 2014-07-31 MED ORDER — GLYCOPYRROLATE 0.2 MG/ML IJ SOLN
INTRAMUSCULAR | Status: DC | PRN
  Administered 2014-07-31: 0.2 mg via INTRAVENOUS

## 2014-07-31 MED ORDER — LIDOCAINE HCL (CARDIAC) 20 MG/ML IV SOLN
INTRAVENOUS | Status: AC
  Filled 2014-07-31: qty 5

## 2014-07-31 SURGICAL SUPPLY — 41 items
ANCH SUT 2 CRKSCW 12X3.5 EYLT (Anchor) ×1 IMPLANT
ANCHOR CORKSCREW 3.5 FIBERWIRE (Anchor) ×1 IMPLANT
BANDAGE ESMARK 6X9 LF (GAUZE/BANDAGES/DRESSINGS) IMPLANT
BIT DRILL LCP QC 2X140 (BIT) ×1 IMPLANT
BNDG CMPR 9X6 STRL LF SNTH (GAUZE/BANDAGES/DRESSINGS)
BNDG COHESIVE 4X5 TAN STRL (GAUZE/BANDAGES/DRESSINGS) ×2 IMPLANT
BNDG ESMARK 6X9 LF (GAUZE/BANDAGES/DRESSINGS)
BNDG GAUZE ELAST 4 BULKY (GAUZE/BANDAGES/DRESSINGS) ×2 IMPLANT
COVER SURGICAL LIGHT HANDLE (MISCELLANEOUS) ×2 IMPLANT
DRAPE INCISE IOBAN 66X45 STRL (DRAPES) ×2 IMPLANT
DRAPE OEC MINIVIEW 54X84 (DRAPES) IMPLANT
DRAPE PROXIMA HALF (DRAPES) ×2 IMPLANT
DRAPE U-SHAPE 47X51 STRL (DRAPES) ×2 IMPLANT
DRSG ADAPTIC 3X8 NADH LF (GAUZE/BANDAGES/DRESSINGS) ×2 IMPLANT
DRSG PAD ABDOMINAL 8X10 ST (GAUZE/BANDAGES/DRESSINGS) ×2 IMPLANT
DURAPREP 26ML APPLICATOR (WOUND CARE) ×2 IMPLANT
ELECT REM PT RETURN 9FT ADLT (ELECTROSURGICAL) ×2
ELECTRODE REM PT RTRN 9FT ADLT (ELECTROSURGICAL) ×1 IMPLANT
GAUZE SPONGE 4X4 12PLY STRL (GAUZE/BANDAGES/DRESSINGS) ×2 IMPLANT
GLOVE BIOGEL PI IND STRL 9 (GLOVE) ×1 IMPLANT
GLOVE BIOGEL PI INDICATOR 9 (GLOVE) ×1
GLOVE SURG ORTHO 9.0 STRL STRW (GLOVE) ×2 IMPLANT
GOWN STRL REUS W/ TWL XL LVL3 (GOWN DISPOSABLE) ×3 IMPLANT
GOWN STRL REUS W/TWL XL LVL3 (GOWN DISPOSABLE) ×6
KIT BASIN OR (CUSTOM PROCEDURE TRAY) ×2 IMPLANT
KIT ROOM TURNOVER OR (KITS) ×2 IMPLANT
MANIFOLD NEPTUNE II (INSTRUMENTS) ×2 IMPLANT
NDL SUT 6 .5 CRC .975X.05 MAYO (NEEDLE) IMPLANT
NEEDLE MAYO TAPER (NEEDLE) ×2
NS IRRIG 1000ML POUR BTL (IV SOLUTION) ×2 IMPLANT
PACK ORTHO EXTREMITY (CUSTOM PROCEDURE TRAY) ×2 IMPLANT
PAD ARMBOARD 7.5X6 YLW CONV (MISCELLANEOUS) ×4 IMPLANT
PASSER SUT SWANSON 36MM LOOP (INSTRUMENTS) ×1 IMPLANT
SPONGE GAUZE 4X4 12PLY STER LF (GAUZE/BANDAGES/DRESSINGS) ×1 IMPLANT
SPONGE LAP 18X18 X RAY DECT (DISPOSABLE) ×2 IMPLANT
SUCTION FRAZIER TIP 10 FR DISP (SUCTIONS) ×2 IMPLANT
SUT ETHILON 2 0 PSLX (SUTURE) IMPLANT
SUT VIC AB 2-0 CTB1 (SUTURE) ×4 IMPLANT
TOWEL OR 17X24 6PK STRL BLUE (TOWEL DISPOSABLE) ×2 IMPLANT
TOWEL OR 17X26 10 PK STRL BLUE (TOWEL DISPOSABLE) ×2 IMPLANT
TUBE CONNECTING 12X1/4 (SUCTIONS) ×2 IMPLANT

## 2014-07-31 NOTE — H&P (Signed)
Sarah Bridges is an 64 y.o. female.   Chief Complaint: Fracture displacement right navicular HPI: Patient is a 64 year old woman with diabetes with acute displacement of a navicular fracture with retraction of the posterior tibial tendon  Past Medical History  Diagnosis Date  . DM (diabetes mellitus)   . Hyperlipidemia   . Peripheral neuropathy   . Vitamin D deficiency   . Hypertension   . Wears glasses   . Avulsion fracture     right foot    Past Surgical History  Procedure Laterality Date  . Tonsillectomy    . Tubal ligation  1976  . Piloninal cyst      age 13  . Colonoscopy  01/17/2012    Procedure: COLONOSCOPY;  Surgeon: Daneil Dolin, MD;  Location: [facility];  Service: Endoscopy;  Laterality: N/A;  8:30    Family History  Problem Relation Age of Onset  . Emphysema Mother     deceased  . Liver disease Neg Hx   . Colon cancer Neg Hx    Social History:  reports that she has never smoked. She has never used smokeless tobacco. She reports that she does not drink alcohol or use illicit drugs.  Allergies: No Known Allergies  No prescriptions prior to admission    No results found for this or any previous visit (from the past 48 hour(s)). No results found.  Review of Systems  All other systems reviewed and are negative.   There were no vitals taken for this visit. Physical Exam  On examination there is a mobile displaced fracture of the navicular with retraction of the posterior tibial tendon. Assessment/Plan Assessment: Displaced fracture right navicular with retraction of the posterior tibial tendon.  Plan: We will plan for open reduction internal fixation of the displaced fracture. Risks and benefits were discussed including infection neurovascular injury recurrence of the displacement. Patient states she understands and wishes to proceed at this time.  Sarah Bridges V 07/31/2014, 6:30 AM

## 2014-07-31 NOTE — Op Note (Signed)
07/31/2014  12:50 PM  PATIENT:  Sarah Bridges    PRE-OPERATIVE DIAGNOSIS:  Avulsion Fracture Navicular Right Foot  POST-OPERATIVE DIAGNOSIS:  Same  PROCEDURE:  OPEN REDUCTION INTERNAL FIXATION (ORIF) FOOT NAVICULAR FRACTURE  SURGEON:  Newt Minion, MD  PHYSICIAN ASSISTANT:None ANESTHESIA:   General  PREOPERATIVE INDICATIONS:  [name] is a  63 y.o. female with a diagnosis of Avulsion Fracture Navicular Right Foot who failed conservative measures and elected for surgical management.    The risks benefits and alternatives were discussed with the patient preoperatively including but not limited to the risks of infection, bleeding, nerve injury, cardiopulmonary complications, the need for revision surgery, among others, and the patient was willing to proceed.  OPERATIVE IMPLANTS: Acumed screw  OPERATIVE FINDINGS: Avulsion fracture posterior tibial tendon  OPERATIVE PROCEDURE  Patient was brought to the operating room after undergoing a popliteal block. After adequate levels of anesthesia were obtained patient's right lower extremity was prepped using DuraPrep draped into a sterile field. A timeout was called. A incision was made along the course of the posterior tibial tendon in line with the navicular. Blunt dissection was carried down to the avulsion fracture site. The bone edges were freshened on both sides. The posterior tibial tendon was freed and advanced. A screw Acumed anchor 3.5 mm in diameter was inserted into the navicular. C-arm fluoroscopy verified alignment. The sutures were then passed through drill holes in the avulsed navicular fragment. The fragment was advanced down to the navicular secured. The wound was irrigated with normal saline. The skin and subcutaneous were closed using 2-0 nylon. A sterile compressive dressing was applied. Patient was taken to the PACU in stable condition plan for discharge to home.

## 2014-07-31 NOTE — Discharge Instructions (Signed)
°What to eat: ° °For your first meals, you should eat lightly; only small meals initially.  If you do not have nausea, you may eat larger meals.  Avoid spicy, greasy and heavy food.   ° °General Anesthesia, Adult, Care After  °Refer to this sheet in the next few weeks. These instructions provide you with information on caring for yourself after your procedure. Your health care provider may also give you more specific instructions. Your treatment has been planned according to current medical practices, but problems sometimes occur. Call your health care provider if you have any problems or questions after your procedure.  °WHAT TO EXPECT AFTER THE PROCEDURE  °After the procedure, it is typical to experience:  °Sleepiness.  °Nausea and vomiting. °HOME CARE INSTRUCTIONS  °For the first 24 hours after general anesthesia:  °Have a responsible person with you.  °Do not drive a car. If you are alone, do not take public transportation.  °Do not drink alcohol.  °Do not take medicine that has not been prescribed by your health care provider.  °Do not sign important papers or make important decisions.  °You may resume a normal diet and activities as directed by your health care provider.  °Change bandages (dressings) as directed.  °If you have questions or problems that seem related to general anesthesia, call the hospital and ask for the anesthetist or anesthesiologist on call. °SEEK MEDICAL CARE IF:  °You have nausea and vomiting that continue the day after anesthesia.  °You develop a rash. °SEEK IMMEDIATE MEDICAL CARE IF:  °You have difficulty breathing.  °You have chest pain.  °You have any allergic problems. °Document Released: 08/28/2000 Document Revised: 01/22/2013 Document Reviewed: 12/05/2012  °ExitCare® Patient Information ©2014 [employer].  ° °Sore Throat  ° ° °A sore throat is a painful, burning, sore, or scratchy feeling of the throat. There may be pain or tenderness when swallowing or talking. You may have  other symptoms with a sore throat. These include coughing, sneezing, fever, or a swollen neck. A sore throat is often the first sign of another sickness. These sicknesses may include a cold, flu, strep throat, or an infection called mono. Most sore throats go away without medical treatment.  °HOME CARE  °Only take medicine as told by your doctor.  °Drink enough fluids to keep your pee (urine) clear or pale yellow.  °Rest as needed.  °Try using throat sprays, lozenges, or suck on hard candy (if older than 4 years or as told).  °Sip warm liquids, such as broth, herbal tea, or warm water with honey. Try sucking on frozen ice pops or drinking cold liquids.  °Rinse the mouth (gargle) with salt water. Mix 1 teaspoon salt with 8 ounces of water.  °Do not smoke. Avoid being around others when they are smoking.  °Put a humidifier in your bedroom at night to moisten the air. You can also turn on a hot shower and sit in the bathroom for 5-10 minutes. Be sure the bathroom door is closed. °GET HELP RIGHT AWAY IF:  °You have trouble breathing.  °You cannot swallow fluids, soft foods, or your spit (saliva).  °You have more puffiness (swelling) in the throat.  °Your sore throat does not get better in 7 days.  °You feel sick to your stomach (nauseous) and throw up (vomit).  °You have a fever or lasting symptoms for more than 2-3 days.  °You have a fever and your symptoms suddenly get worse. °MAKE SURE YOU:  °Understand these   instructions.  °Will watch your condition.  °Will get help right away if you are not doing well or get worse. °Document Released: 02/29/2008 Document Revised: 02/14/2012 Document Reviewed: 01/28/2012  °ExitCare® Patient Information ©2015 [employer]. This information is not intended to replace advice given to you by your health care provider. Make sure you discuss any questions you have with your health care provider.  ° ° ° °

## 2014-07-31 NOTE — Anesthesia Preprocedure Evaluation (Signed)
Anesthesia Evaluation  Patient identified by MRN, date of birth, ID band Patient awake    Reviewed: Allergy & Precautions, NPO status , Patient's Chart, lab work & pertinent test results  History of Anesthesia Complications Negative for: history of anesthetic complications  Airway Mallampati: II  TM Distance: >3 FB Neck ROM: Full    Dental  (+) Teeth Intact   Pulmonary neg pulmonary ROS,  breath sounds clear to auscultation        Cardiovascular hypertension, Pt. on medications Rhythm:Regular     Neuro/Psych  Neuromuscular disease negative psych ROS   GI/Hepatic negative GI ROS, Neg liver ROS,   Endo/Other  diabetes, Type 2Morbid obesity  Renal/GU      Musculoskeletal   Abdominal   Peds  Hematology   Anesthesia Other Findings   Reproductive/Obstetrics                             Anesthesia Physical Anesthesia Plan  ASA: II  Anesthesia Plan: Regional   Post-op Pain Management:    Induction: Intravenous  Airway Management Planned: Natural Airway  Additional Equipment: None  Intra-op Plan:   Post-operative Plan:   Informed Consent: I have reviewed the patients History and Physical, chart, labs and discussed the procedure including the risks, benefits and alternatives for the proposed anesthesia with the patient or authorized representative who has indicated his/her understanding and acceptance.   Dental advisory given  Plan Discussed with: CRNA and Surgeon  Anesthesia Plan Comments:         Anesthesia Quick Evaluation

## 2014-07-31 NOTE — Anesthesia Procedure Notes (Signed)
Procedures

## 2014-07-31 NOTE — Progress Notes (Signed)
(  late entry) Patient noted to have a large, bulky dressing on the inner aspect of the right foot, which we noted would have put undue pressure on the operative site.  Dr. Sharol Given paged, orders received to place patient in a splint.  Ortho tech at the bedside to place splint, patient discharge with splint in place.  Instructions regarding weight bearing was given by Ortho tech.

## 2014-07-31 NOTE — Progress Notes (Signed)
Orthopedic Tech Progress Note Patient Details:  Sarah Bridges Feb 14, 1951 [id number]  Ortho Devices Type of Ortho Device: Ace wrap, Post (short leg) splint, Stirrup splint Ortho Device/Splint Location: rle Ortho Device/Splint Interventions: Application   Kiptyn Rafuse 07/31/2014, 3:11 PM

## 2014-07-31 NOTE — Anesthesia Postprocedure Evaluation (Signed)
  Anesthesia Post-op Note  Patient: Sarah Bridges  Procedure(s) Performed: Procedure(s): OPEN REDUCTION INTERNAL FIXATION (ORIF) FOOT NAVICULAR FRACTURE (Right)  Patient Location: PACU  Anesthesia Type:MAC and Regional  Level of Consciousness: awake  Airway and Oxygen Therapy: Patient Spontanous Breathing  Post-op Pain: none  Post-op Assessment: Post-op Vital signs reviewed, Patient's Cardiovascular Status Stable, Respiratory Function Stable, Patent Airway, No signs of Nausea or vomiting and Pain level controlled  Post-op Vital Signs: Reviewed and stable  Last Vitals:  Filed Vitals:   07/31/14 1400  BP: 175/76  Pulse: 78  Temp: 36.5 C  Resp: 15    Complications: No apparent anesthesia complications

## 2014-07-31 NOTE — Transfer of Care (Signed)
Immediate Anesthesia Transfer of Care Note  Patient: Sarah Bridges  Procedure(s) Performed: Procedure(s): OPEN REDUCTION INTERNAL FIXATION (ORIF) FOOT NAVICULAR FRACTURE (Right)  Patient Location: PACU  Anesthesia Type:MAC combined with regional for post-op pain  Level of Consciousness: awake  Airway & Oxygen Therapy: Patient Spontanous Breathing and Patient connected to nasal cannula oxygen  Post-op Assessment: Report given to RN and Post -op Vital signs reviewed and stable  Post vital signs: stable  Last Vitals:  Filed Vitals:   07/31/14 1017  Pulse: 68  Temp: 36.6 C  Resp: 18    Complications: No apparent anesthesia complications

## 2014-08-03 ENCOUNTER — Encounter (HOSPITAL_COMMUNITY): Payer: Self-pay | Admitting: Orthopedic Surgery

## 2014-08-20 ENCOUNTER — Other Ambulatory Visit (HOSPITAL_COMMUNITY): Payer: Self-pay | Admitting: Orthopedic Surgery

## 2014-08-20 ENCOUNTER — Encounter (HOSPITAL_COMMUNITY): Payer: Self-pay | Admitting: *Deleted

## 2014-08-21 ENCOUNTER — Ambulatory Visit (HOSPITAL_COMMUNITY): Payer: BC Managed Care – PPO | Admitting: Anesthesiology

## 2014-08-21 ENCOUNTER — Ambulatory Visit (HOSPITAL_COMMUNITY)
Admission: RE | Admit: 2014-08-21 | Discharge: 2014-08-21 | Disposition: A | Discharge status: Home / Self Care | Payer: BC Managed Care – PPO | Source: Ambulatory Visit | Attending: Orthopedic Surgery | Admitting: Orthopedic Surgery

## 2014-08-21 ENCOUNTER — Encounter (HOSPITAL_COMMUNITY): Payer: Self-pay | Admitting: Surgery

## 2014-08-21 ENCOUNTER — Encounter (HOSPITAL_COMMUNITY)
Admission: RE | Disposition: A | Discharge status: Home / Self Care | Payer: Self-pay | Source: Ambulatory Visit | Attending: Orthopedic Surgery

## 2014-08-21 DIAGNOSIS — I10 Essential (primary) hypertension: Secondary | ICD-10-CM | POA: Diagnosis not present

## 2014-08-21 DIAGNOSIS — Y838 Other surgical procedures as the cause of abnormal reaction of the patient, or of later complication, without mention of misadventure at the time of the procedure: Secondary | ICD-10-CM | POA: Insufficient documentation

## 2014-08-21 DIAGNOSIS — Y92239 Unspecified place in hospital as the place of occurrence of the external cause: Secondary | ICD-10-CM | POA: Diagnosis not present

## 2014-08-21 DIAGNOSIS — E119 Type 2 diabetes mellitus without complications: Secondary | ICD-10-CM | POA: Diagnosis not present

## 2014-08-21 DIAGNOSIS — E785 Hyperlipidemia, unspecified: Secondary | ICD-10-CM | POA: Diagnosis not present

## 2014-08-21 DIAGNOSIS — S86111S Strain of other muscle(s) and tendon(s) of posterior muscle group at lower leg level, right leg, sequela: Secondary | ICD-10-CM

## 2014-08-21 DIAGNOSIS — T8132XA Disruption of internal operation (surgical) wound, not elsewhere classified, initial encounter: Secondary | ICD-10-CM | POA: Insufficient documentation

## 2014-08-21 DIAGNOSIS — E559 Vitamin D deficiency, unspecified: Secondary | ICD-10-CM | POA: Diagnosis not present

## 2014-08-21 DIAGNOSIS — T8130XA Disruption of wound, unspecified, initial encounter: Secondary | ICD-10-CM | POA: Diagnosis present

## 2014-08-21 HISTORY — PX: HARDWARE REMOVAL: SHX979

## 2014-08-21 LAB — CBC
HCT: 32.4 % — ABNORMAL LOW (ref 36.0–46.0)
HEMOGLOBIN: 11.2 g/dL — AB (ref 12.0–15.0)
MCH: 29.1 pg (ref 26.0–34.0)
MCHC: 34.6 g/dL (ref 30.0–36.0)
MCV: 84.2 fL (ref 78.0–100.0)
PLATELETS: 125 10*3/uL — AB (ref 150–400)
RBC: 3.85 MIL/uL — ABNORMAL LOW (ref 3.87–5.11)
RDW: 13.9 % (ref 11.5–15.5)
WBC: 7.2 10*3/uL (ref 4.0–10.5)

## 2014-08-21 LAB — BASIC METABOLIC PANEL
Anion gap: 9 (ref 5–15)
BUN: 14 mg/dL (ref 6–23)
CHLORIDE: 106 mmol/L (ref 96–112)
CO2: 25 mmol/L (ref 19–32)
Calcium: 9.4 mg/dL (ref 8.4–10.5)
Creatinine, Ser: 1.18 mg/dL — ABNORMAL HIGH (ref 0.50–1.10)
GFR calc Af Amer: 56 mL/min — ABNORMAL LOW (ref >90)
GFR calc non Af Amer: 48 mL/min — ABNORMAL LOW (ref >90)
Glucose, Bld: 92 mg/dL (ref 70–99)
POTASSIUM: 4.4 mmol/L (ref 3.5–5.1)
SODIUM: 140 mmol/L (ref 135–145)

## 2014-08-21 LAB — GLUCOSE, CAPILLARY
Glucose-Capillary: 82 mg/dL (ref 70–99)
Glucose-Capillary: 79 mg/dL (ref 70–99)
Glucose-Capillary: 158 mg/dL — ABNORMAL HIGH (ref 70–99)

## 2014-08-21 SURGERY — REMOVAL, HARDWARE
Anesthesia: General | Laterality: Right

## 2014-08-21 MED ORDER — SCOPOLAMINE 1 MG/3DAYS TD PT72
MEDICATED_PATCH | TRANSDERMAL | Status: DC | PRN
  Administered 2014-08-21: 1 via TRANSDERMAL

## 2014-08-21 MED ORDER — FENTANYL CITRATE 0.05 MG/ML IJ SOLN: 25.0000 ug | INTRAMUSCULAR | Status: DC | PRN

## 2014-08-21 MED ORDER — LIDOCAINE HCL (CARDIAC) 20 MG/ML IV SOLN
INTRAVENOUS | Status: AC
  Filled 2014-08-21: qty 5

## 2014-08-21 MED ORDER — GENTAMICIN SULFATE 40 MG/ML IJ SOLN
INTRAMUSCULAR | Status: AC
  Filled 2014-08-21: qty 4

## 2014-08-21 MED ORDER — PROPOFOL 10 MG/ML IV BOLUS
INTRAVENOUS | Status: AC
  Filled 2014-08-21: qty 20

## 2014-08-21 MED ORDER — LIDOCAINE HCL (CARDIAC) 20 MG/ML IV SOLN
INTRAVENOUS | Status: DC | PRN
  Administered 2014-08-21: 80 mg via INTRAVENOUS

## 2014-08-21 MED ORDER — VANCOMYCIN HCL 500 MG IV SOLR
INTRAVENOUS | Status: AC
  Filled 2014-08-21: qty 500

## 2014-08-21 MED ORDER — ONDANSETRON HCL 4 MG/2ML IJ SOLN: 4.0000 mg | Freq: Four times a day (QID) | INTRAMUSCULAR | Status: DC | PRN

## 2014-08-21 MED ORDER — LACTATED RINGERS IV SOLN
INTRAVENOUS | Status: DC
  Administered 2014-08-21: 16:00:00 via INTRAVENOUS

## 2014-08-21 MED ORDER — EPHEDRINE SULFATE 50 MG/ML IJ SOLN
INTRAMUSCULAR | Status: AC
  Filled 2014-08-21: qty 1

## 2014-08-21 MED ORDER — PROPOFOL 10 MG/ML IV BOLUS
INTRAVENOUS | Status: DC | PRN
  Administered 2014-08-21: 200 mg via INTRAVENOUS

## 2014-08-21 MED ORDER — DEXTROSE-NACL 5-0.2 % IV SOLN
INTRAVENOUS | Status: DC | PRN
  Administered 2014-08-21: 18:00:00 via INTRAVENOUS

## 2014-08-21 MED ORDER — FENTANYL CITRATE 0.05 MG/ML IJ SOLN
INTRAMUSCULAR | Status: AC
  Filled 2014-08-21: qty 5

## 2014-08-21 MED ORDER — GENTAMICIN SULFATE 40 MG/ML IJ SOLN
INTRAMUSCULAR | Status: DC | PRN
  Administered 2014-08-21: 80 mg via INTRAMUSCULAR

## 2014-08-21 MED ORDER — OXYCODONE HCL 5 MG/5ML PO SOLN: 5.0000 mg | Freq: Once | ORAL | Status: DC | PRN

## 2014-08-21 MED ORDER — SCOPOLAMINE 1 MG/3DAYS TD PT72
MEDICATED_PATCH | TRANSDERMAL | Status: AC
  Filled 2014-08-21: qty 1

## 2014-08-21 MED ORDER — CARVEDILOL 12.5 MG PO TABS
ORAL_TABLET | ORAL | Status: AC
  Filled 2014-08-21: qty 1

## 2014-08-21 MED ORDER — FENTANYL CITRATE 0.05 MG/ML IJ SOLN
INTRAMUSCULAR | Status: DC | PRN
  Administered 2014-08-21: 100 ug via INTRAVENOUS

## 2014-08-21 MED ORDER — CEFAZOLIN SODIUM-DEXTROSE 2-3 GM-% IV SOLR
2.0000 g | INTRAVENOUS | Status: AC
  Administered 2014-08-21: 2 g via INTRAVENOUS
  Filled 2014-08-21: qty 50

## 2014-08-21 MED ORDER — OXYCODONE HCL 5 MG PO TABS: 5.0000 mg | ORAL_TABLET | Freq: Once | ORAL | Status: DC | PRN

## 2014-08-21 MED ORDER — CARVEDILOL 12.5 MG PO TABS
12.5000 mg | ORAL_TABLET | Freq: Once | ORAL | Status: AC
  Administered 2014-08-21: 12.5 mg via ORAL

## 2014-08-21 MED ORDER — EPHEDRINE SULFATE 50 MG/ML IJ SOLN
INTRAMUSCULAR | Status: DC | PRN
  Administered 2014-08-21 (×2): 10 mg via INTRAVENOUS

## 2014-08-21 MED ORDER — ONDANSETRON HCL 4 MG/2ML IJ SOLN
INTRAMUSCULAR | Status: AC
  Filled 2014-08-21: qty 2

## 2014-08-21 MED ORDER — ONDANSETRON HCL 4 MG/2ML IJ SOLN
INTRAMUSCULAR | Status: DC | PRN
  Administered 2014-08-21: 4 mg via INTRAVENOUS

## 2014-08-21 MED ORDER — VANCOMYCIN HCL 500 MG IV SOLR
INTRAVENOUS | Status: DC | PRN
  Administered 2014-08-21: 500 mg

## 2014-08-21 MED ORDER — SODIUM CHLORIDE 0.9 % IV SOLN
INTRAVENOUS | Status: DC | PRN
  Administered 2014-08-21 (×2): via INTRAVENOUS

## 2014-08-21 MED ORDER — 0.9 % SODIUM CHLORIDE (POUR BTL) OPTIME
TOPICAL | Status: DC | PRN
  Administered 2014-08-21: 1000 mL

## 2014-08-21 SURGICAL SUPPLY — 53 items
ANCH SUT 2 FT CRKSW 14.7X5.5 (Anchor) ×1 IMPLANT
ANCHOR CORKSCREW FIBER 5.5X15 (Anchor) ×1 IMPLANT
BANDAGE ELASTIC 4 VELCRO ST LF (GAUZE/BANDAGES/DRESSINGS) IMPLANT
BANDAGE ELASTIC 6 VELCRO ST LF (GAUZE/BANDAGES/DRESSINGS) IMPLANT
BANDAGE ESMARK 6X9 LF (GAUZE/BANDAGES/DRESSINGS) IMPLANT
BNDG CMPR 9X6 STRL LF SNTH (GAUZE/BANDAGES/DRESSINGS)
BNDG COHESIVE 4X5 TAN STRL (GAUZE/BANDAGES/DRESSINGS) ×1 IMPLANT
BNDG ESMARK 6X9 LF (GAUZE/BANDAGES/DRESSINGS)
BNDG GAUZE ELAST 4 BULKY (GAUZE/BANDAGES/DRESSINGS) ×2 IMPLANT
COVER SURGICAL LIGHT HANDLE (MISCELLANEOUS) ×3 IMPLANT
CUFF TOURNIQUET SINGLE 34IN LL (TOURNIQUET CUFF) IMPLANT
DRAPE U-SHAPE 47X51 STRL (DRAPES) ×1 IMPLANT
DRSG EMULSION OIL 3X3 NADH (GAUZE/BANDAGES/DRESSINGS) ×2 IMPLANT
DRSG PAD ABDOMINAL 8X10 ST (GAUZE/BANDAGES/DRESSINGS) ×2 IMPLANT
DURAPREP 26ML APPLICATOR (WOUND CARE) ×1 IMPLANT
ELECT REM PT RETURN 9FT ADLT (ELECTROSURGICAL) ×2
ELECTRODE REM PT RTRN 9FT ADLT (ELECTROSURGICAL) ×1 IMPLANT
GAUZE SPONGE 4X4 12PLY STRL (GAUZE/BANDAGES/DRESSINGS) ×2 IMPLANT
GLOVE BIOGEL PI IND STRL 9 (GLOVE) ×1 IMPLANT
GLOVE BIOGEL PI INDICATOR 9 (GLOVE) ×1
GLOVE SURG ORTHO 9.0 STRL STRW (GLOVE) ×2 IMPLANT
GLOVE SURG SS PI 6.5 STRL IVOR (GLOVE) ×1 IMPLANT
GOWN STRL REUS W/ TWL XL LVL3 (GOWN DISPOSABLE) ×3 IMPLANT
GOWN STRL REUS W/TWL XL LVL3 (GOWN DISPOSABLE) ×6
KIT BASIN OR (CUSTOM PROCEDURE TRAY) ×2 IMPLANT
KIT ROOM TURNOVER OR (KITS) ×2 IMPLANT
KIT STIMULAN RAPID CURE 5CC (Orthopedic Implant) ×2 IMPLANT
MANIFOLD NEPTUNE II (INSTRUMENTS) ×2 IMPLANT
NDL SUT 6 .5 CRC .975X.05 MAYO (NEEDLE) IMPLANT
NEEDLE MAYO TAPER (NEEDLE) ×2
NS IRRIG 1000ML POUR BTL (IV SOLUTION) ×2 IMPLANT
PACK ORTHO EXTREMITY (CUSTOM PROCEDURE TRAY) ×2 IMPLANT
PAD ABD 8X10 STRL (GAUZE/BANDAGES/DRESSINGS) ×1 IMPLANT
PAD ARMBOARD 7.5X6 YLW CONV (MISCELLANEOUS) ×4 IMPLANT
PAD CAST 4YDX4 CTTN HI CHSV (CAST SUPPLIES) ×1 IMPLANT
PADDING CAST COTTON 4X4 STRL (CAST SUPPLIES) ×2
SPONGE GAUZE 4X4 12PLY STER LF (GAUZE/BANDAGES/DRESSINGS) ×1 IMPLANT
SPONGE LAP 18X18 X RAY DECT (DISPOSABLE) ×2 IMPLANT
STAPLER VISISTAT 35W (STAPLE) IMPLANT
STOCKINETTE IMPERVIOUS 9X36 MD (GAUZE/BANDAGES/DRESSINGS) IMPLANT
SUCTION FRAZIER TIP 10 FR DISP (SUCTIONS) ×1 IMPLANT
SUT ETHILON 2 0 PSLX (SUTURE) IMPLANT
SUT FIBERWIRE #2 38 T-5 BLUE (SUTURE) ×2
SUT VIC AB 0 CT1 27 (SUTURE)
SUT VIC AB 0 CT1 27XBRD ANBCTR (SUTURE) IMPLANT
SUT VIC AB 2-0 CT1 27 (SUTURE)
SUT VIC AB 2-0 CT1 TAPERPNT 27 (SUTURE) IMPLANT
SUTURE FIBERWR #2 38 T-5 BLUE (SUTURE) ×1 IMPLANT
TOWEL OR 17X24 6PK STRL BLUE (TOWEL DISPOSABLE) ×2 IMPLANT
TOWEL OR 17X26 10 PK STRL BLUE (TOWEL DISPOSABLE) ×2 IMPLANT
TUBING CONNECTOR 18X5MM (MISCELLANEOUS) ×2 IMPLANT
UNDERPAD 30X30 INCONTINENT (UNDERPADS AND DIAPERS) ×1 IMPLANT
WATER STERILE IRR 1000ML POUR (IV SOLUTION) ×2 IMPLANT

## 2014-08-21 NOTE — H&P (Signed)
Sarah Bridges is an 64 y.o. female.   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 Patient presents at this time with wound dehiscence with failure of the internal fixation.  Past Medical History  Diagnosis Date  . Hyperlipidemia   . Peripheral neuropathy   . Vitamin D deficiency   . Hypertension   . Wears glasses   . Avulsion fracture     right foot  . DM (diabetes mellitus)     Type II    Past Surgical History  Procedure Laterality Date  . Tonsillectomy    . Tubal ligation  1976  . Piloninal cyst      age 89  . Colonoscopy  01/17/2012    Procedure: COLONOSCOPY;  Surgeon: Daneil Dolin, MD;  [facility];  Service: Endoscopy;  Laterality: N/A;  8:30  . Open reduction internal fixation (orif) foot lisfranc fracture Right 07/31/2014    Procedure: OPEN REDUCTION INTERNAL FIXATION (ORIF) FOOT NAVICULAR FRACTURE;  Surgeon: Newt Minion, MD;  Location: [facility];  Service: Orthopedics;  Laterality: Right;    Family History  Problem Relation Age of Onset  . Emphysema Mother     deceased  . Liver disease Neg Hx   . Colon cancer Neg Hx    Social History:  reports that she has never smoked. She has never used smokeless tobacco. She reports that she does not drink alcohol or use illicit drugs.  Allergies: No Known Allergies  No prescriptions prior to admission    No results found for this or any previous visit (from the past 48 hour(s)). No results found.  Review of Systems  All other systems reviewed and are negative.   There were no vitals taken for this visit. Physical Exam  On examination patient has no cellulitis but the wound has dehisced she is 3 weeks out. Radiographs shows loss of reduction with the internal fixation. Assessment/Plan Assessment: Wound dehiscence with loss of fixation right foot.  Plan:  We'll plan for revision of the surgical incision irrigation and debridement placement of antibiotic beads and removal of the failed deep retained hardware. Due to the wound dehiscence most likely due to low-grade infection do not feel that it would be safe to replace internal fixation at this time.  Annice Jolly V 08/21/2014, 6:31 AM

## 2014-08-21 NOTE — Op Note (Signed)
08/21/2014  7:58 PM  PATIENT:  Sarah Bridges    PRE-OPERATIVE DIAGNOSIS:  Dehiscence Right Foot Incision  POST-OPERATIVE DIAGNOSIS:  Same  PROCEDURE:  Irrigation and Debridement Right Foot, Removal Fixation and  Place Antibiotic Beads Reconstruction with repair of the avulsion of the posterior tibial tendon.  SURGEON:  Newt Minion, MD  PHYSICIAN ASSISTANT:None ANESTHESIA:   General  PREOPERATIVE INDICATIONS:  [name] is a  64 y.o. female with a diagnosis of Dehiscence Right Foot Incision who failed conservative measures and elected for surgical management.    The risks benefits and alternatives were discussed with the patient preoperatively including but not limited to the risks of infection, bleeding, nerve injury, cardiopulmonary complications, the need for revision surgery, among others, and the patient was willing to proceed.  OPERATIVE IMPLANTS: 5.5 anchor  OPERATIVE FINDINGS: Wound dehiscence 3 weeks postoperatively with complete failure of the internal fixation.  OPERATIVE PROCEDURE: Patient was brought to the operating room and underwent a general anesthetic. After adequate levels of anesthesia were obtained patient's right lower extremity was prepped using DuraPrep draped into a sterile field. An elliptical incision is made around her previous incision to excise all the necrotic tissue. The accessory navicular avulsion fragment and completely torn through the suture. This nonviable piece of bone was excised from the distal aspect of the posterior tibial tendon. Using Kessler technique and #2 FiberWire was woven through the distal aspect of the posterior tibial tendon. The previous suture anchor was removed. A larger 5. 5 suture anchor was then placed into the navicular. The suture anchor had 4 sutures emanating and 2 other sutures were secured to the 2 sutures coming out of the posterior tibial tendon. The posterior tibial tendon was advanced to the navicular.  Using a free needle and additional suture from the anchor was then woven through the posterior tibial tendon in a Kessler technique and this was then Reattached with a total of 4 strands from the proximal and distal aspect to secure the tendon. The repair was stable. The wound was irrigated with normal saline. Antibiotic beads were placed deep within the wound for most likely an early infection which caused the wound dehiscence. A sterile compressive dressing was applied the incision was closed using 2-0 nylon. Patient was extubated taken to the PACU in stable condition

## 2014-08-21 NOTE — Anesthesia Procedure Notes (Signed)
Procedure Name: LMA Insertion Date/Time: 08/21/2014 5:54 PM Performed by: Marinda Elk A Pre-anesthesia Checklist: Patient identified, Timeout performed, Emergency Drugs available, Suction available and Patient being monitored Patient Re-evaluated:Patient Re-evaluated prior to inductionOxygen Delivery Method: Circle system utilized Preoxygenation: Pre-oxygenation with 100% oxygen Intubation Type: IV induction LMA: LMA inserted LMA Size: 4.0 Number of attempts: 1 Placement Confirmation: breath sounds checked- equal and bilateral Tube secured with: Tape Dental Injury: Teeth and Oropharynx as per pre-operative assessment

## 2014-08-21 NOTE — Transfer of Care (Signed)
Immediate Anesthesia Transfer of Care Note  Patient: Sarah Bridges  Procedure(s) Performed: Procedure(s): Irrigation and Debridement Right Foot, Removal Fixation and Place Antibiotic Beads (Right)  Patient Location: PACU  Anesthesia Type:General  Level of Consciousness: awake  Airway & Oxygen Therapy: Patient Spontanous Breathing  Post-op Assessment: Report given to RN and Post -op Vital signs reviewed and stable  Post vital signs: Reviewed and stable  Last Vitals:  Filed Vitals:   08/21/14 1700  BP: 203/70  Pulse:   Temp:   Resp: 16    Complications: No apparent anesthesia complications

## 2014-08-21 NOTE — Anesthesia Postprocedure Evaluation (Signed)
  Anesthesia Post-op Note  Patient: Sarah Bridges  Procedure(s) Performed: Procedure(s): Irrigation and Debridement Right Foot, Removal Fixation and Place Antibiotic Beads (Right)  Patient Location: PACU  Anesthesia Type:General  Level of Consciousness: awake  Airway and Oxygen Therapy: Patient Spontanous Breathing  Post-op Pain: mild  Post-op Assessment: Post-op Vital signs reviewed  Post-op Vital Signs: Reviewed  Last Vitals:  Filed Vitals:   08/21/14 1930  BP: 160/74  Pulse: 76  Temp: 36.4 C  Resp: 12    Complications: No apparent anesthesia complications

## 2014-08-21 NOTE — Progress Notes (Signed)
Orthopedic Tech Progress Note Patient Details:  Sarah Bridges Aug 06, 1950 [id number]  Ortho Devices Type of Ortho Device: CAM walker Ortho Device/Splint Location: RLE Ortho Device/Splint Interventions: Ordered, Application   Braulio Bosch 08/21/2014, 7:03 PM

## 2014-08-21 NOTE — Anesthesia Preprocedure Evaluation (Addendum)
Anesthesia Evaluation  Patient identified by MRN, date of birth, ID band Patient awake    Reviewed: Allergy & Precautions, NPO status , Patient's Chart, lab work & pertinent test results  Airway Mallampati: II  TM Distance: >3 FB Neck ROM: full    Dental  (+) Teeth Intact, Dental Advisory Given   Pulmonary neg pulmonary ROS,          Cardiovascular hypertension, Pt. on medications and Pt. on home beta blockers     Neuro/Psych  Neuromuscular disease    GI/Hepatic   Endo/Other  diabetes, Type 2obese  Renal/GU      Musculoskeletal   Abdominal   Peds  Hematology   Anesthesia Other Findings   Reproductive/Obstetrics                           Anesthesia Physical Anesthesia Plan  ASA: II  Anesthesia Plan: General   Post-op Pain Management:    Induction: Intravenous  Airway Management Planned: LMA  Additional Equipment:   Intra-op Plan:   Post-operative Plan:   Informed Consent: I have reviewed the patients History and Physical, chart, labs and discussed the procedure including the risks, benefits and alternatives for the proposed anesthesia with the patient or authorized representative who has indicated his/her understanding and acceptance.     Plan Discussed with: CRNA, Anesthesiologist and Surgeon  Anesthesia Plan Comments:         Anesthesia Quick Evaluation

## 2014-08-25 ENCOUNTER — Encounter (HOSPITAL_COMMUNITY): Payer: Self-pay | Admitting: Orthopedic Surgery

## 2014-09-14 ENCOUNTER — Encounter (INDEPENDENT_AMBULATORY_CARE_PROVIDER_SITE_OTHER): Payer: BC Managed Care – PPO | Admitting: Ophthalmology

## 2014-09-14 DIAGNOSIS — I10 Essential (primary) hypertension: Secondary | ICD-10-CM

## 2014-09-14 DIAGNOSIS — E11351 Type 2 diabetes mellitus with proliferative diabetic retinopathy with macular edema: Secondary | ICD-10-CM | POA: Diagnosis not present

## 2014-09-14 DIAGNOSIS — E11331 Type 2 diabetes mellitus with moderate nonproliferative diabetic retinopathy with macular edema: Secondary | ICD-10-CM

## 2014-09-14 DIAGNOSIS — H43813 Vitreous degeneration, bilateral: Secondary | ICD-10-CM | POA: Diagnosis not present

## 2014-09-14 DIAGNOSIS — H2513 Age-related nuclear cataract, bilateral: Secondary | ICD-10-CM

## 2014-09-14 DIAGNOSIS — E11311 Type 2 diabetes mellitus with unspecified diabetic retinopathy with macular edema: Secondary | ICD-10-CM

## 2014-09-14 DIAGNOSIS — H35033 Hypertensive retinopathy, bilateral: Secondary | ICD-10-CM | POA: Diagnosis not present

## 2014-11-23 ENCOUNTER — Encounter (INDEPENDENT_AMBULATORY_CARE_PROVIDER_SITE_OTHER): Payer: BC Managed Care – PPO | Admitting: Ophthalmology

## 2014-11-26 ENCOUNTER — Encounter (INDEPENDENT_AMBULATORY_CARE_PROVIDER_SITE_OTHER): Payer: BC Managed Care – PPO | Admitting: Ophthalmology

## 2014-11-26 DIAGNOSIS — E11331 Type 2 diabetes mellitus with moderate nonproliferative diabetic retinopathy with macular edema: Secondary | ICD-10-CM

## 2014-11-26 DIAGNOSIS — E11311 Type 2 diabetes mellitus with unspecified diabetic retinopathy with macular edema: Secondary | ICD-10-CM

## 2014-11-26 DIAGNOSIS — I10 Essential (primary) hypertension: Secondary | ICD-10-CM

## 2014-11-26 DIAGNOSIS — H43813 Vitreous degeneration, bilateral: Secondary | ICD-10-CM

## 2014-11-26 DIAGNOSIS — H2513 Age-related nuclear cataract, bilateral: Secondary | ICD-10-CM | POA: Diagnosis not present

## 2014-11-26 DIAGNOSIS — H35033 Hypertensive retinopathy, bilateral: Secondary | ICD-10-CM | POA: Diagnosis not present

## 2014-11-26 DIAGNOSIS — E11351 Type 2 diabetes mellitus with proliferative diabetic retinopathy with macular edema: Secondary | ICD-10-CM

## 2014-12-09 ENCOUNTER — Encounter: Payer: Self-pay | Admitting: Internal Medicine

## 2015-01-14 ENCOUNTER — Encounter (INDEPENDENT_AMBULATORY_CARE_PROVIDER_SITE_OTHER): Payer: BC Managed Care – PPO | Admitting: Ophthalmology

## 2015-01-14 DIAGNOSIS — E11311 Type 2 diabetes mellitus with unspecified diabetic retinopathy with macular edema: Secondary | ICD-10-CM | POA: Diagnosis not present

## 2015-01-14 DIAGNOSIS — E11359 Type 2 diabetes mellitus with proliferative diabetic retinopathy without macular edema: Secondary | ICD-10-CM | POA: Diagnosis not present

## 2015-01-14 DIAGNOSIS — I10 Essential (primary) hypertension: Secondary | ICD-10-CM

## 2015-01-14 DIAGNOSIS — H35033 Hypertensive retinopathy, bilateral: Secondary | ICD-10-CM | POA: Diagnosis not present

## 2015-01-14 DIAGNOSIS — H43813 Vitreous degeneration, bilateral: Secondary | ICD-10-CM | POA: Diagnosis not present

## 2015-01-14 DIAGNOSIS — H2513 Age-related nuclear cataract, bilateral: Secondary | ICD-10-CM

## 2015-01-14 DIAGNOSIS — E11351 Type 2 diabetes mellitus with proliferative diabetic retinopathy with macular edema: Secondary | ICD-10-CM

## 2015-01-14 DIAGNOSIS — D3131 Benign neoplasm of right choroid: Secondary | ICD-10-CM

## 2015-03-12 ENCOUNTER — Encounter (INDEPENDENT_AMBULATORY_CARE_PROVIDER_SITE_OTHER): Payer: BC Managed Care – PPO | Admitting: Ophthalmology

## 2015-03-12 DIAGNOSIS — D3131 Benign neoplasm of right choroid: Secondary | ICD-10-CM | POA: Diagnosis not present

## 2015-03-12 DIAGNOSIS — E113311 Type 2 diabetes mellitus with moderate nonproliferative diabetic retinopathy with macular edema, right eye: Secondary | ICD-10-CM

## 2015-03-12 DIAGNOSIS — E11311 Type 2 diabetes mellitus with unspecified diabetic retinopathy with macular edema: Secondary | ICD-10-CM | POA: Diagnosis not present

## 2015-03-12 DIAGNOSIS — H43813 Vitreous degeneration, bilateral: Secondary | ICD-10-CM | POA: Diagnosis not present

## 2015-03-12 DIAGNOSIS — H35033 Hypertensive retinopathy, bilateral: Secondary | ICD-10-CM | POA: Diagnosis not present

## 2015-03-12 DIAGNOSIS — I10 Essential (primary) hypertension: Secondary | ICD-10-CM | POA: Diagnosis not present

## 2015-03-12 DIAGNOSIS — H2513 Age-related nuclear cataract, bilateral: Secondary | ICD-10-CM | POA: Diagnosis not present

## 2015-03-12 DIAGNOSIS — E113512 Type 2 diabetes mellitus with proliferative diabetic retinopathy with macular edema, left eye: Secondary | ICD-10-CM | POA: Diagnosis not present

## 2015-05-07 ENCOUNTER — Encounter (INDEPENDENT_AMBULATORY_CARE_PROVIDER_SITE_OTHER): Payer: BC Managed Care – PPO | Admitting: Ophthalmology

## 2015-05-07 DIAGNOSIS — E11311 Type 2 diabetes mellitus with unspecified diabetic retinopathy with macular edema: Secondary | ICD-10-CM | POA: Diagnosis not present

## 2015-05-07 DIAGNOSIS — I10 Essential (primary) hypertension: Secondary | ICD-10-CM

## 2015-05-07 DIAGNOSIS — H35033 Hypertensive retinopathy, bilateral: Secondary | ICD-10-CM

## 2015-05-07 DIAGNOSIS — D3131 Benign neoplasm of right choroid: Secondary | ICD-10-CM | POA: Diagnosis not present

## 2015-05-07 DIAGNOSIS — E113592 Type 2 diabetes mellitus with proliferative diabetic retinopathy without macular edema, left eye: Secondary | ICD-10-CM | POA: Diagnosis not present

## 2015-05-07 DIAGNOSIS — E113311 Type 2 diabetes mellitus with moderate nonproliferative diabetic retinopathy with macular edema, right eye: Secondary | ICD-10-CM

## 2015-05-07 DIAGNOSIS — H43813 Vitreous degeneration, bilateral: Secondary | ICD-10-CM | POA: Diagnosis not present

## 2015-07-02 ENCOUNTER — Encounter (INDEPENDENT_AMBULATORY_CARE_PROVIDER_SITE_OTHER): Payer: BC Managed Care – PPO | Admitting: Ophthalmology

## 2015-07-02 DIAGNOSIS — E113592 Type 2 diabetes mellitus with proliferative diabetic retinopathy without macular edema, left eye: Secondary | ICD-10-CM | POA: Diagnosis not present

## 2015-07-02 DIAGNOSIS — H35033 Hypertensive retinopathy, bilateral: Secondary | ICD-10-CM | POA: Diagnosis not present

## 2015-07-02 DIAGNOSIS — E113311 Type 2 diabetes mellitus with moderate nonproliferative diabetic retinopathy with macular edema, right eye: Secondary | ICD-10-CM | POA: Diagnosis not present

## 2015-07-02 DIAGNOSIS — E11311 Type 2 diabetes mellitus with unspecified diabetic retinopathy with macular edema: Secondary | ICD-10-CM

## 2015-07-02 DIAGNOSIS — D3131 Benign neoplasm of right choroid: Secondary | ICD-10-CM | POA: Diagnosis not present

## 2015-07-02 DIAGNOSIS — I10 Essential (primary) hypertension: Secondary | ICD-10-CM

## 2015-07-02 DIAGNOSIS — H43813 Vitreous degeneration, bilateral: Secondary | ICD-10-CM

## 2015-09-03 ENCOUNTER — Encounter (INDEPENDENT_AMBULATORY_CARE_PROVIDER_SITE_OTHER): Payer: BC Managed Care – PPO | Admitting: Ophthalmology

## 2015-09-03 DIAGNOSIS — I10 Essential (primary) hypertension: Secondary | ICD-10-CM | POA: Diagnosis not present

## 2015-09-03 DIAGNOSIS — H43813 Vitreous degeneration, bilateral: Secondary | ICD-10-CM | POA: Diagnosis not present

## 2015-09-03 DIAGNOSIS — H35033 Hypertensive retinopathy, bilateral: Secondary | ICD-10-CM

## 2015-09-03 DIAGNOSIS — E113592 Type 2 diabetes mellitus with proliferative diabetic retinopathy without macular edema, left eye: Secondary | ICD-10-CM | POA: Diagnosis not present

## 2015-09-03 DIAGNOSIS — E113311 Type 2 diabetes mellitus with moderate nonproliferative diabetic retinopathy with macular edema, right eye: Secondary | ICD-10-CM | POA: Diagnosis not present

## 2015-09-03 DIAGNOSIS — E11311 Type 2 diabetes mellitus with unspecified diabetic retinopathy with macular edema: Secondary | ICD-10-CM

## 2015-09-03 DIAGNOSIS — D3131 Benign neoplasm of right choroid: Secondary | ICD-10-CM

## 2015-11-05 ENCOUNTER — Encounter (INDEPENDENT_AMBULATORY_CARE_PROVIDER_SITE_OTHER): Payer: BC Managed Care – PPO | Admitting: Ophthalmology

## 2015-11-05 DIAGNOSIS — H35033 Hypertensive retinopathy, bilateral: Secondary | ICD-10-CM

## 2015-11-05 DIAGNOSIS — I10 Essential (primary) hypertension: Secondary | ICD-10-CM

## 2015-11-05 DIAGNOSIS — E11311 Type 2 diabetes mellitus with unspecified diabetic retinopathy with macular edema: Secondary | ICD-10-CM | POA: Diagnosis not present

## 2015-11-05 DIAGNOSIS — E113592 Type 2 diabetes mellitus with proliferative diabetic retinopathy without macular edema, left eye: Secondary | ICD-10-CM | POA: Diagnosis not present

## 2015-11-05 DIAGNOSIS — E113311 Type 2 diabetes mellitus with moderate nonproliferative diabetic retinopathy with macular edema, right eye: Secondary | ICD-10-CM

## 2015-11-05 DIAGNOSIS — H4312 Vitreous hemorrhage, left eye: Secondary | ICD-10-CM | POA: Diagnosis not present

## 2015-11-19 ENCOUNTER — Other Ambulatory Visit (INDEPENDENT_AMBULATORY_CARE_PROVIDER_SITE_OTHER): Payer: BC Managed Care – PPO | Admitting: Ophthalmology

## 2015-11-19 DIAGNOSIS — E113592 Type 2 diabetes mellitus with proliferative diabetic retinopathy without macular edema, left eye: Secondary | ICD-10-CM

## 2015-11-19 DIAGNOSIS — E11311 Type 2 diabetes mellitus with unspecified diabetic retinopathy with macular edema: Secondary | ICD-10-CM

## 2015-12-30 ENCOUNTER — Encounter (INDEPENDENT_AMBULATORY_CARE_PROVIDER_SITE_OTHER): Payer: BC Managed Care – PPO | Admitting: Ophthalmology

## 2015-12-30 DIAGNOSIS — E113512 Type 2 diabetes mellitus with proliferative diabetic retinopathy with macular edema, left eye: Secondary | ICD-10-CM | POA: Diagnosis not present

## 2015-12-30 DIAGNOSIS — E113311 Type 2 diabetes mellitus with moderate nonproliferative diabetic retinopathy with macular edema, right eye: Secondary | ICD-10-CM

## 2015-12-30 DIAGNOSIS — H35033 Hypertensive retinopathy, bilateral: Secondary | ICD-10-CM | POA: Diagnosis not present

## 2015-12-30 DIAGNOSIS — D3131 Benign neoplasm of right choroid: Secondary | ICD-10-CM

## 2015-12-30 DIAGNOSIS — E11311 Type 2 diabetes mellitus with unspecified diabetic retinopathy with macular edema: Secondary | ICD-10-CM | POA: Diagnosis not present

## 2015-12-30 DIAGNOSIS — H43813 Vitreous degeneration, bilateral: Secondary | ICD-10-CM | POA: Diagnosis not present

## 2015-12-30 DIAGNOSIS — H2513 Age-related nuclear cataract, bilateral: Secondary | ICD-10-CM | POA: Diagnosis not present

## 2015-12-30 DIAGNOSIS — I10 Essential (primary) hypertension: Secondary | ICD-10-CM

## 2016-01-12 ENCOUNTER — Encounter (INDEPENDENT_AMBULATORY_CARE_PROVIDER_SITE_OTHER): Payer: BC Managed Care – PPO | Admitting: Ophthalmology

## 2016-03-02 ENCOUNTER — Encounter (INDEPENDENT_AMBULATORY_CARE_PROVIDER_SITE_OTHER): Payer: BC Managed Care – PPO | Admitting: Ophthalmology

## 2016-03-02 DIAGNOSIS — H35033 Hypertensive retinopathy, bilateral: Secondary | ICD-10-CM | POA: Diagnosis not present

## 2016-03-02 DIAGNOSIS — H43813 Vitreous degeneration, bilateral: Secondary | ICD-10-CM

## 2016-03-02 DIAGNOSIS — I10 Essential (primary) hypertension: Secondary | ICD-10-CM | POA: Diagnosis not present

## 2016-03-02 DIAGNOSIS — E11311 Type 2 diabetes mellitus with unspecified diabetic retinopathy with macular edema: Secondary | ICD-10-CM

## 2016-03-02 DIAGNOSIS — E113311 Type 2 diabetes mellitus with moderate nonproliferative diabetic retinopathy with macular edema, right eye: Secondary | ICD-10-CM | POA: Diagnosis not present

## 2016-03-02 DIAGNOSIS — E113512 Type 2 diabetes mellitus with proliferative diabetic retinopathy with macular edema, left eye: Secondary | ICD-10-CM

## 2016-03-02 DIAGNOSIS — D3131 Benign neoplasm of right choroid: Secondary | ICD-10-CM | POA: Diagnosis not present

## 2016-05-04 ENCOUNTER — Encounter (INDEPENDENT_AMBULATORY_CARE_PROVIDER_SITE_OTHER): Payer: BC Managed Care – PPO | Admitting: Ophthalmology

## 2016-05-04 DIAGNOSIS — D3131 Benign neoplasm of right choroid: Secondary | ICD-10-CM

## 2016-05-04 DIAGNOSIS — H35033 Hypertensive retinopathy, bilateral: Secondary | ICD-10-CM | POA: Diagnosis not present

## 2016-05-04 DIAGNOSIS — E113311 Type 2 diabetes mellitus with moderate nonproliferative diabetic retinopathy with macular edema, right eye: Secondary | ICD-10-CM | POA: Diagnosis not present

## 2016-05-04 DIAGNOSIS — H43813 Vitreous degeneration, bilateral: Secondary | ICD-10-CM | POA: Diagnosis not present

## 2016-05-04 DIAGNOSIS — I10 Essential (primary) hypertension: Secondary | ICD-10-CM

## 2016-05-04 DIAGNOSIS — E113592 Type 2 diabetes mellitus with proliferative diabetic retinopathy without macular edema, left eye: Secondary | ICD-10-CM

## 2016-05-04 DIAGNOSIS — E11311 Type 2 diabetes mellitus with unspecified diabetic retinopathy with macular edema: Secondary | ICD-10-CM | POA: Diagnosis not present

## 2016-07-05 ENCOUNTER — Encounter (INDEPENDENT_AMBULATORY_CARE_PROVIDER_SITE_OTHER): Payer: BC Managed Care – PPO | Admitting: Ophthalmology

## 2016-07-05 DIAGNOSIS — H2513 Age-related nuclear cataract, bilateral: Secondary | ICD-10-CM | POA: Diagnosis not present

## 2016-07-05 DIAGNOSIS — E11311 Type 2 diabetes mellitus with unspecified diabetic retinopathy with macular edema: Secondary | ICD-10-CM

## 2016-07-05 DIAGNOSIS — I10 Essential (primary) hypertension: Secondary | ICD-10-CM | POA: Diagnosis not present

## 2016-07-05 DIAGNOSIS — E113592 Type 2 diabetes mellitus with proliferative diabetic retinopathy without macular edema, left eye: Secondary | ICD-10-CM

## 2016-07-05 DIAGNOSIS — E113311 Type 2 diabetes mellitus with moderate nonproliferative diabetic retinopathy with macular edema, right eye: Secondary | ICD-10-CM | POA: Diagnosis not present

## 2016-07-05 DIAGNOSIS — H35033 Hypertensive retinopathy, bilateral: Secondary | ICD-10-CM

## 2016-07-05 DIAGNOSIS — H43813 Vitreous degeneration, bilateral: Secondary | ICD-10-CM | POA: Diagnosis not present

## 2016-07-13 ENCOUNTER — Inpatient Hospital Stay (HOSPITAL_COMMUNITY)
Admission: EM | Admit: 2016-07-13 | Discharge: 2016-07-19 | DRG: 240 | Disposition: A | Discharge status: Home Health | Payer: BC Managed Care – PPO | Attending: Internal Medicine | Admitting: Internal Medicine

## 2016-07-13 ENCOUNTER — Encounter (HOSPITAL_COMMUNITY): Payer: Self-pay | Admitting: *Deleted

## 2016-07-13 ENCOUNTER — Emergency Department (HOSPITAL_COMMUNITY): Payer: BC Managed Care – PPO

## 2016-07-13 DIAGNOSIS — E860 Dehydration: Secondary | ICD-10-CM | POA: Diagnosis present

## 2016-07-13 DIAGNOSIS — B999 Unspecified infectious disease: Secondary | ICD-10-CM

## 2016-07-13 DIAGNOSIS — E119 Type 2 diabetes mellitus without complications: Secondary | ICD-10-CM

## 2016-07-13 DIAGNOSIS — I10 Essential (primary) hypertension: Secondary | ICD-10-CM | POA: Diagnosis present

## 2016-07-13 DIAGNOSIS — I129 Hypertensive chronic kidney disease with stage 1 through stage 4 chronic kidney disease, or unspecified chronic kidney disease: Secondary | ICD-10-CM | POA: Diagnosis present

## 2016-07-13 DIAGNOSIS — Z79899 Other long term (current) drug therapy: Secondary | ICD-10-CM

## 2016-07-13 DIAGNOSIS — N183 Chronic kidney disease, stage 3 unspecified: Secondary | ICD-10-CM | POA: Diagnosis present

## 2016-07-13 DIAGNOSIS — E1159 Type 2 diabetes mellitus with other circulatory complications: Secondary | ICD-10-CM | POA: Diagnosis not present

## 2016-07-13 DIAGNOSIS — Z794 Long term (current) use of insulin: Secondary | ICD-10-CM

## 2016-07-13 DIAGNOSIS — I96 Gangrene, not elsewhere classified: Secondary | ICD-10-CM | POA: Diagnosis present

## 2016-07-13 DIAGNOSIS — L03115 Cellulitis of right lower limb: Secondary | ICD-10-CM | POA: Diagnosis present

## 2016-07-13 DIAGNOSIS — M7989 Other specified soft tissue disorders: Secondary | ICD-10-CM | POA: Diagnosis not present

## 2016-07-13 DIAGNOSIS — E1165 Type 2 diabetes mellitus with hyperglycemia: Secondary | ICD-10-CM | POA: Diagnosis present

## 2016-07-13 DIAGNOSIS — E1142 Type 2 diabetes mellitus with diabetic polyneuropathy: Secondary | ICD-10-CM | POA: Diagnosis present

## 2016-07-13 DIAGNOSIS — D649 Anemia, unspecified: Secondary | ICD-10-CM | POA: Diagnosis present

## 2016-07-13 DIAGNOSIS — N179 Acute kidney failure, unspecified: Secondary | ICD-10-CM | POA: Diagnosis present

## 2016-07-13 DIAGNOSIS — E1152 Type 2 diabetes mellitus with diabetic peripheral angiopathy with gangrene: Secondary | ICD-10-CM | POA: Diagnosis not present

## 2016-07-13 DIAGNOSIS — E785 Hyperlipidemia, unspecified: Secondary | ICD-10-CM | POA: Diagnosis present

## 2016-07-13 DIAGNOSIS — D638 Anemia in other chronic diseases classified elsewhere: Secondary | ICD-10-CM | POA: Diagnosis present

## 2016-07-13 DIAGNOSIS — L03039 Cellulitis of unspecified toe: Secondary | ICD-10-CM | POA: Diagnosis present

## 2016-07-13 DIAGNOSIS — R2689 Other abnormalities of gait and mobility: Secondary | ICD-10-CM

## 2016-07-13 DIAGNOSIS — E1122 Type 2 diabetes mellitus with diabetic chronic kidney disease: Secondary | ICD-10-CM | POA: Diagnosis present

## 2016-07-13 DIAGNOSIS — E559 Vitamin D deficiency, unspecified: Secondary | ICD-10-CM | POA: Diagnosis present

## 2016-07-13 LAB — GLUCOSE, CAPILLARY: Glucose-Capillary: 401 mg/dL — ABNORMAL HIGH (ref 65–99)

## 2016-07-13 LAB — COMPREHENSIVE METABOLIC PANEL
ALT: 12 U/L — ABNORMAL LOW (ref 14–54)
AST: 16 U/L (ref 15–41)
Albumin: 2.6 g/dL — ABNORMAL LOW (ref 3.5–5.0)
Alkaline Phosphatase: 66 U/L (ref 38–126)
Anion gap: 9 (ref 5–15)
BUN: 37 mg/dL — ABNORMAL HIGH (ref 6–20)
CHLORIDE: 101 mmol/L (ref 101–111)
CO2: 22 mmol/L (ref 22–32)
Calcium: 8.6 mg/dL — ABNORMAL LOW (ref 8.9–10.3)
Creatinine, Ser: 1.98 mg/dL — ABNORMAL HIGH (ref 0.44–1.00)
GFR calc Af Amer: 30 mL/min — ABNORMAL LOW (ref >60)
GFR, EST NON AFRICAN AMERICAN: 26 mL/min — AB (ref >60)
Glucose, Bld: 377 mg/dL — ABNORMAL HIGH (ref 65–99)
POTASSIUM: 5 mmol/L (ref 3.5–5.1)
Sodium: 132 mmol/L — ABNORMAL LOW (ref 135–145)
Total Bilirubin: 0.7 mg/dL (ref 0.3–1.2)
Total Protein: 7.4 g/dL (ref 6.5–8.1)

## 2016-07-13 LAB — C-REACTIVE PROTEIN: CRP: 19.9 mg/dL — ABNORMAL HIGH (ref <1.0)

## 2016-07-13 LAB — CBC WITH DIFFERENTIAL/PLATELET
BASOS ABS: 0 10*3/uL (ref 0.0–0.1)
Basophils Relative: 0 %
Eosinophils Absolute: 0 10*3/uL (ref 0.0–0.7)
Eosinophils Relative: 0 %
HCT: 25.5 % — ABNORMAL LOW (ref 36.0–46.0)
Hemoglobin: 8.7 g/dL — ABNORMAL LOW (ref 12.0–15.0)
LYMPHS ABS: 1 10*3/uL (ref 0.7–4.0)
Lymphocytes Relative: 9 %
MCH: 30.1 pg (ref 26.0–34.0)
MCHC: 34.1 g/dL (ref 30.0–36.0)
MCV: 88.2 fL (ref 78.0–100.0)
Monocytes Absolute: 0.4 10*3/uL (ref 0.1–1.0)
Monocytes Relative: 3 %
Neutro Abs: 9.2 10*3/uL — ABNORMAL HIGH (ref 1.7–7.7)
Neutrophils Relative %: 88 %
Platelets: 250 10*3/uL (ref 150–400)
RBC: 2.89 MIL/uL — ABNORMAL LOW (ref 3.87–5.11)
RDW: 12.6 % (ref 11.5–15.5)
WBC: 10.5 10*3/uL (ref 4.0–10.5)

## 2016-07-13 LAB — GLUCOSE, RANDOM: GLUCOSE: 373 mg/dL — AB (ref 65–99)

## 2016-07-13 LAB — LACTIC ACID, PLASMA: Lactic Acid, Venous: 1.2 mmol/L (ref 0.5–1.9)

## 2016-07-13 MED ORDER — PIPERACILLIN-TAZOBACTAM 3.375 G IVPB 30 MIN
3.3750 g | Freq: Once | INTRAVENOUS | Status: AC
  Administered 2016-07-13: 3.375 g via INTRAVENOUS
  Filled 2016-07-13: qty 50

## 2016-07-13 MED ORDER — VANCOMYCIN HCL IN DEXTROSE 1-5 GM/200ML-% IV SOLN
1000.0000 mg | Freq: Once | INTRAVENOUS | Status: AC
  Administered 2016-07-13: 1000 mg via INTRAVENOUS
  Filled 2016-07-13: qty 200

## 2016-07-13 MED ORDER — CLINDAMYCIN PHOSPHATE 600 MG/50ML IV SOLN
600.0000 mg | Freq: Once | INTRAVENOUS | Status: AC
  Administered 2016-07-13: 600 mg via INTRAVENOUS
  Filled 2016-07-13: qty 50

## 2016-07-13 MED ORDER — FERROUS SULFATE 325 (65 FE) MG PO TABS
325.0000 mg | ORAL_TABLET | Freq: Two times a day (BID) | ORAL | Status: DC
  Administered 2016-07-13 – 2016-07-19 (×11): 325 mg via ORAL
  Filled 2016-07-13 (×11): qty 1

## 2016-07-13 MED ORDER — ENOXAPARIN SODIUM 30 MG/0.3ML ~~LOC~~ SOLN
30.0000 mg | SUBCUTANEOUS | Status: DC
  Administered 2016-07-13: 30 mg via SUBCUTANEOUS
  Filled 2016-07-13: qty 0.3

## 2016-07-13 MED ORDER — INSULIN DETEMIR 100 UNIT/ML ~~LOC~~ SOLN
25.0000 [IU] | Freq: Every evening | SUBCUTANEOUS | Status: DC
  Administered 2016-07-13: 25 [IU] via SUBCUTANEOUS
  Filled 2016-07-13 (×4): qty 0.25

## 2016-07-13 MED ORDER — PIPERACILLIN-TAZOBACTAM 3.375 G IVPB
3.3750 g | Freq: Three times a day (TID) | INTRAVENOUS | Status: DC
  Administered 2016-07-14 – 2016-07-18 (×13): 3.375 g via INTRAVENOUS
  Filled 2016-07-13 (×12): qty 50

## 2016-07-13 MED ORDER — CLINDAMYCIN PHOSPHATE 600 MG/50ML IV SOLN
600.0000 mg | Freq: Four times a day (QID) | INTRAVENOUS | Status: DC
  Administered 2016-07-14 (×2): 600 mg via INTRAVENOUS
  Filled 2016-07-13 (×15): qty 50

## 2016-07-13 MED ORDER — ACETAMINOPHEN 650 MG RE SUPP: 650.0000 mg | Freq: Four times a day (QID) | RECTAL | Status: DC | PRN

## 2016-07-13 MED ORDER — SODIUM CHLORIDE 0.9 % IV SOLN
INTRAVENOUS | Status: DC
  Administered 2016-07-14 – 2016-07-15 (×3): via INTRAVENOUS
  Administered 2016-07-15: 75 mL/h via INTRAVENOUS
  Administered 2016-07-16: 15:00:00 via INTRAVENOUS

## 2016-07-13 MED ORDER — HYDRALAZINE HCL 25 MG PO TABS: 25.0000 mg | ORAL_TABLET | Freq: Four times a day (QID) | ORAL | Status: DC | PRN

## 2016-07-13 MED ORDER — VANCOMYCIN HCL IN DEXTROSE 1-5 GM/200ML-% IV SOLN
1000.0000 mg | INTRAVENOUS | Status: DC
  Administered 2016-07-14 – 2016-07-17 (×4): 1000 mg via INTRAVENOUS
  Filled 2016-07-13 (×3): qty 200

## 2016-07-13 MED ORDER — ACETAMINOPHEN 325 MG PO TABS: 650.0000 mg | ORAL_TABLET | Freq: Four times a day (QID) | ORAL | Status: DC | PRN

## 2016-07-13 MED ORDER — ONDANSETRON HCL 4 MG PO TABS: 4.0000 mg | ORAL_TABLET | Freq: Four times a day (QID) | ORAL | Status: DC | PRN

## 2016-07-13 MED ORDER — SODIUM CHLORIDE 0.9 % IV BOLUS (SEPSIS)
1000.0000 mL | Freq: Once | INTRAVENOUS | Status: AC
  Administered 2016-07-13: 1000 mL via INTRAVENOUS

## 2016-07-13 MED ORDER — INSULIN ASPART 100 UNIT/ML ~~LOC~~ SOLN
0.0000 [IU] | Freq: Three times a day (TID) | SUBCUTANEOUS | Status: DC
  Administered 2016-07-14: 7 [IU] via SUBCUTANEOUS
  Administered 2016-07-14: 3 [IU] via SUBCUTANEOUS

## 2016-07-13 MED ORDER — VANCOMYCIN HCL IN DEXTROSE 1-5 GM/200ML-% IV SOLN: 1000.0000 mg | Freq: Once | INTRAVENOUS | Status: DC

## 2016-07-13 MED ORDER — ONDANSETRON HCL 4 MG/2ML IJ SOLN: 4.0000 mg | Freq: Four times a day (QID) | INTRAMUSCULAR | Status: DC | PRN

## 2016-07-13 NOTE — Progress Notes (Signed)
Pharmacy Antibiotic Note  Sarah Bridges is a 66 y.o. female admitted on 07/13/2016 with cellulitis, Diabetic wet gangrene of foot.  Pharmacy has been consulted for Vancomycin and Zosyn  dosing.  Plan: Vancomycin 1gm IV every 24 hours.  Goal trough 15-20 mcg/mL.  Zosyn 3.375gm IV every 8 hours. Follow-up micro data, labs, vitals.   Height: 5\' 2"  (157.5 cm) Weight: 171 lb 8.3 oz (77.8 kg) IBW/kg (Calculated) : 50.1  Temp (24hrs), Avg:99 F (37.2 C), Min:98.7 F (37.1 C), Max:99.3 F (37.4 C)   Recent Labs Lab 07/13/16 1844 07/13/16 1849  WBC 10.5  --   CREATININE 1.98*  --   LATICACIDVEN  --  1.2    Estimated Creatinine Clearance: 27.7 mL/min (by C-G formula based on SCr of 1.98 mg/dL (H)).    No Known Allergies  Antimicrobials this admission: Vanc 2/8 >>  Zosyn 2/8 >>  Clinda 2/8 >>   Dose adjustments this admission: n/a   Microbiology results: 2/8 BCx: pending 2/8 Wound: pending   Thank you for allowing pharmacy to be a part of this patient's care.  Pricilla Larsson 07/13/2016 10:32 PM

## 2016-07-13 NOTE — ED Triage Notes (Signed)
Pt c/o "infection" from right great toe. Pt reports her toe nail of the right great toe came off a couple weeks ago and she has been trying to keep the area clean with peroxide and antibiotic ointement. Pt's toe is black with green and yellow drainage. Pt reports she cannot feel her foot due to neuropathy. Pt diabetic. Denies fever. Pulses in right foot palpable.

## 2016-07-13 NOTE — ED Provider Notes (Signed)
[facility] DEPT Provider Note   CSN: [id number] Arrival date & time: 07/13/16  1627     History   Chief Complaint Chief Complaint  Patient presents with  . Wound Infection    HPI Sarah Bridges is a 66 y.o. female.  66 year old female history of diabetes, hyperlipidemia, hypertension and peripheral neuropathy presents with a discolored toe. Patient states that a couple weeks ago she pulled her toenail off causing a wound. She treated it with at home remedies however over the next week it turned darker. Her last 3-4 days she's had progressively worsening swelling and redness coming from it. She also felt overall ill. No appetite, nausea. She has not had any fevers. No other injuries or wounds elsewhere. No history of the same. No exacerbating or associated factors otherwise.       Past Medical History:  Diagnosis Date  . Avulsion fracture    right foot  . DM (diabetes mellitus) ([place])    Type II  . Hyperlipidemia   . Hypertension   . Peripheral neuropathy ([place])   . Vitamin D deficiency   . Wears glasses     Patient Active Problem List   Diagnosis Date Noted  . 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 Hypertension 07/13/2016  . Diabetes mellitus ([place]) 12/19/2011  . Encounter for screening colonoscopy 12/19/2011    Past Surgical History:  Procedure Laterality Date  . COLONOSCOPY  01/17/2012   Procedure: COLONOSCOPY;  Surgeon: Daneil Dolin, MD;  [facility];  Service: Endoscopy;  Laterality: N/A;  8:30  . HARDWARE REMOVAL Right 08/21/2014   Procedure: Irrigation and Debridement Right Foot, Removal Fixation and Place Antibiotic Beads;  Surgeon: Newt Minion, MD;  Location: [facility];  Service: Orthopedics;  Laterality: Right;  . OPEN REDUCTION INTERNAL FIXATION (ORIF) FOOT LISFRANC FRACTURE Right 07/31/2014   Procedure: OPEN REDUCTION INTERNAL FIXATION (ORIF) FOOT NAVICULAR FRACTURE;  Surgeon: Newt Minion, MD;  Location: [facility];  Service:  Orthopedics;  Laterality: Right;  . piloninal cyst     age 82  . TONSILLECTOMY    . TUBAL LIGATION  1976    OB History    No data available       Home Medications    Prior to Admission medications   Medication Sig Start Date End Date Taking? Authorizing Provider  Cholecalciferol (VITAMIN D) 2000 units tablet Take 2,000 Units by mouth daily.   Yes Historical Provider, MD  ferrous sulfate 325 (65 FE) MG tablet Take 325 mg by mouth 2 (two) times daily.   Yes Historical Provider, MD  glipiZIDE (GLUCOTROL) 10 MG tablet Take 10 mg by mouth 2 (two) times daily. 05/25/14  Yes Historical Provider, MD  Insulin Detemir (LEVEMIR FLEXPEN) 100 UNIT/ML Pen Inject 25 Units into the skin every evening.   Yes Historical Provider, MD  metFORMIN (GLUCOPHAGE) 500 MG tablet Take 1,000 mg by mouth 2 (two) times daily with a meal.  12/15/11  Yes Historical Provider, MD  valsartan-hydrochlorothiazide (DIOVAN-HCT) 320-25 MG tablet Take 1 tablet by mouth daily.   Yes Historical Provider, MD    Family History Family History  Problem Relation Age of Onset  . Emphysema Mother     deceased  . Liver disease Neg Hx   . Colon cancer Neg Hx     Social History Social History  Substance Use Topics  . Smoking status: Never Smoker  . Smokeless tobacco: Never Used  . Alcohol use No  Allergies   Patient has no known allergies.   Review of Systems Review of Systems  Constitutional: Positive for fatigue.  Gastrointestinal: Positive for nausea.  Neurological: Positive for weakness.  All other systems reviewed and are negative.    Physical Exam Updated Vital Signs BP (!) 147/65 (BP Location: Right Arm)   Pulse 75   Temp 98.7 F (37.1 C) (Oral)   Resp 20   Ht 5\' 2"  (1.575 m)   Wt 171 lb 8.3 oz (77.8 kg)   SpO2 100%   BMI 31.37 kg/m   Physical Exam  Constitutional: She is oriented to person, place, and time. She appears well-developed and well-nourished.  HENT:  Head: Normocephalic and  atraumatic.  Eyes: Conjunctivae and EOM are normal.  Neck: Normal range of motion.  Cardiovascular: Normal rate and regular rhythm.   Pulmonary/Chest: No stridor. No respiratory distress.  Abdominal: Soft. She exhibits no distension. There is no tenderness.  Musculoskeletal: Normal range of motion. She exhibits edema and tenderness.  Neurological: She is alert and oriented to person, place, and time. No cranial nerve deficit. Coordination normal.  Skin: Skin is warm and dry. There is erythema (see pictures below for areas in question).  Nursing note and vitals reviewed.          ED Treatments / Results  Labs (all labs ordered are listed, but only abnormal results are displayed) Labs Reviewed  COMPREHENSIVE METABOLIC PANEL - Abnormal; Notable for the following:       Result Value   Sodium 132 (*)    Glucose, Bld 377 (*)    BUN 37 (*)    Creatinine, Ser 1.98 (*)    Calcium 8.6 (*)    Albumin 2.6 (*)    ALT 12 (*)    GFR calc non Af Amer 26 (*)    GFR calc Af Amer 30 (*)    All other components within normal limits  CBC WITH DIFFERENTIAL/PLATELET - Abnormal; Notable for the following:    RBC 2.89 (*)    Hemoglobin 8.7 (*)    HCT 25.5 (*)    Neutro Abs 9.2 (*)    All other components within normal limits  C-REACTIVE PROTEIN - Abnormal; Notable for the following:    CRP 19.9 (*)    All other components within normal limits  GLUCOSE, CAPILLARY - Abnormal; Notable for the following:    Glucose-Capillary 401 (*)    All other components within normal limits  GLUCOSE, RANDOM - Abnormal; Notable for the following:    Glucose, Bld 373 (*)    All other components within normal limits  CULTURE, BLOOD (ROUTINE X 2)  CULTURE, BLOOD (ROUTINE X 2)  AEROBIC/ANAEROBIC CULTURE (SURGICAL/DEEP WOUND)  LACTIC ACID, PLASMA  HEMOGLOBIN A1C  CBC  COMPREHENSIVE METABOLIC PANEL    EKG  EKG Interpretation None       Radiology Dg Foot Complete Right  Result Date:  07/13/2016 CLINICAL DATA:  Great toe infection EXAM: RIGHT FOOT COMPLETE - 3+ VIEW COMPARISON:  None. FINDINGS: There is subcutaneous gas about the proximal phalanx first digit and distal interphalangeal joint first digit consistent with bacterial infection. No clear osseous erosion identified. No extension of gas beyond the proximal phalanx. IMPRESSION: 1. Subcutaneous along the great toe consistent with gas-forming bacteria. Recommend surgical consultation. 2. No clear evidence of osteomyelitis. Electronically Signed   By: Suzy Bouchard M.D.   On: 07/13/2016 19:51    Procedures Procedures (including critical care time)  Medications Ordered in ED Medications  ferrous sulfate tablet 325 mg (not administered)  insulin detemir (LEVEMIR) injection 25 Units (not administered)  enoxaparin (LOVENOX) injection 30 mg (not administered)  0.9 %  sodium chloride infusion (not administered)  ondansetron (ZOFRAN) tablet 4 mg (not administered)    Or  ondansetron (ZOFRAN) injection 4 mg (not administered)  acetaminophen (TYLENOL) tablet 650 mg (not administered)    Or  acetaminophen (TYLENOL) suppository 650 mg (not administered)  clindamycin (CLEOCIN) IVPB 600 mg (not administered)  insulin aspart (novoLOG) injection 0-9 Units (not administered)  hydrALAZINE (APRESOLINE) tablet 25 mg (not administered)  vancomycin (VANCOCIN) IVPB 1000 mg/200 mL premix (not administered)  piperacillin-tazobactam (ZOSYN) IVPB 3.375 g (not administered)  sodium chloride 0.9 % bolus 1,000 mL (1,000 mLs Intravenous New Bag/Given 07/13/16 1916)  vancomycin (VANCOCIN) IVPB 1000 mg/200 mL premix (1,000 mg Intravenous New Bag/Given 07/13/16 2045)  clindamycin (CLEOCIN) IVPB 600 mg (0 mg Intravenous Stopped 07/13/16 2047)  piperacillin-tazobactam (ZOSYN) IVPB 3.375 g (0 g Intravenous Stopped 07/13/16 2006)     Initial Impression / Assessment and Plan / ED Course  I have reviewed the triage vital signs and the nursing  notes.  Pertinent labs & imaging results that were available during my care of the patient were reviewed by me and considered in my medical decision making (see chart for details).    Necrotic toe likely from infection, also with surrounding cellulitis. Will get labs. Vanc/zosyn/clindamycin. Likely needs amputation.   D/w Dr. Aline Brochure, ortho, who will see the patient in the morning. Also discussed with hospitalist who will admit for IV antibiotics and further management.  Final Clinical Impressions(s) / ED Diagnoses   Final diagnoses:  Cellulitis of right lower extremity    New Prescriptions Current Discharge Medication List       Merrily Pew, MD 07/13/16 2348

## 2016-07-13 NOTE — H&P (Signed)
TRH H&P    Patient Demographics:    Sarah Bridges, is a 66 y.o. female  MRN: [id number]  DOB - 1950-12-13  Admit Date - 07/13/2016  Referring MD/NP/PA: Dr Dayna Barker  Outpatient Primary MD for the patient is [name], DO  Patient coming from: Home  Chief Complaint  Patient presents with  . Wound Infection      HPI:    Sarah Bridges  is a 66 y.o. female, With history of diabetes mellitus, diabetic neuropathy, hypertension, hyperlipidemia who came to hospital with discolored right big toe for past 2 weeks. Patient says that 2 weeks ago she noticed that the nail from beat to came off while taking her socks. She did not want to go to PCP office and took care of it at home. A week ago patient noticed that the toe was becoming black in color, still she did not want to go to doctor's office. Today patient noticed that the toe had become worse, so she came to the ED for further evaluation. She denies pain cause as she has diabetic peripheral neuropathy, and does not have sensation in the feet. She denies fever, no nausea vomiting or diarrhea. No chest pain or shortness of breath.  In the ED x-ray of the foot showed subcutaneous tissue with gas-forming bacteria. Patient empirically started on vancomycin, Zosyn and clindamycin. Blood cultures and wound cultures obtained in the ED    Review of systems:    In addition to the HPI above,  No Fever-chills, No Headache, No changes with Vision or hearing, No problems swallowing food or Liquids, No Chest pain, Cough or Shortness of Breath, No Abdominal pain, No Nausea or Vomiting, bowel movements are regular, No Blood in stool or Urine, No dysuria, No new joints pains-aches,  No new weakness, tingling, numbness in any extremity, No recent weight gain or loss, No polyuria, polydypsia or polyphagia, No significant Mental Stressors.  A full 10 point Review of  Systems was done, except as stated above, all other Review of Systems were negative.   With Past History of the following :    Past Medical History:  Diagnosis Date  . Avulsion fracture    right foot  . DM (diabetes mellitus) ([place])    Type II  . Hyperlipidemia   . Hypertension   . Peripheral neuropathy ([place])   . Vitamin D deficiency   . Wears glasses       Past Surgical History:  Procedure Laterality Date  . COLONOSCOPY  01/17/2012   Procedure: COLONOSCOPY;  Surgeon: Daneil Dolin, MD;  [facility];  Service: Endoscopy;  Laterality: N/A;  8:30  . HARDWARE REMOVAL Right 08/21/2014   Procedure: Irrigation and Debridement Right Foot, Removal Fixation and Place Antibiotic Beads;  Surgeon: Newt Minion, MD;  Location: [place];  Service: Orthopedics;  Laterality: Right;  . OPEN REDUCTION INTERNAL FIXATION (ORIF) FOOT LISFRANC FRACTURE Right 07/31/2014   Procedure: OPEN REDUCTION INTERNAL FIXATION (ORIF) FOOT NAVICULAR FRACTURE;  Surgeon: Newt Minion, MD;  Location: [place];  Service:  Orthopedics;  Laterality: Right;  . piloninal cyst     age 50  . TONSILLECTOMY    . TUBAL LIGATION  1976      Social History:      Social History  Substance Use Topics  . Smoking status: Never Smoker  . Smokeless tobacco: Never Used  . Alcohol use No       Family History :     Family History  Problem Relation Age of Onset  . Emphysema Mother     deceased  . Liver disease Neg Hx   . Colon cancer Neg Hx       Home Medications:   Prior to Admission medications   Medication Sig Start Date End Date Taking? Authorizing Provider  Cholecalciferol (VITAMIN D) 2000 units tablet Take 2,000 Units by mouth daily.   Yes Historical Provider, MD  ferrous sulfate 325 (65 FE) MG tablet Take 325 mg by mouth 2 (two) times daily.   Yes Historical Provider, MD  glipiZIDE (GLUCOTROL) 10 MG tablet Take 10 mg by mouth 2 (two) times daily. 05/25/14  Yes Historical Provider, MD  Insulin Detemir  (LEVEMIR FLEXPEN) 100 UNIT/ML Pen Inject 25 Units into the skin every evening.   Yes Historical Provider, MD  metFORMIN (GLUCOPHAGE) 500 MG tablet Take 1,000 mg by mouth 2 (two) times daily with a meal.  12/15/11  Yes Historical Provider, MD  valsartan-hydrochlorothiazide (DIOVAN-HCT) 320-25 MG tablet Take 1 tablet by mouth daily.   Yes Historical Provider, MD     Allergies:    No Known Allergies   Physical Exam:   Vitals  Blood pressure 137/65, pulse 80, temperature 99.3 F (37.4 C), temperature source Oral, resp. rate 19, height 5\' 2"  (1.575 m), weight 77.1 kg (170 lb), SpO2 96 %.  1.  General: Appears in no acute distress  2. Psychiatric:  Intact judgement and  insight, awake alert, oriented x 3.  3. Neurologic: No focal neurological deficits, all cranial nerves intact.Strength 5/5 all 4 extremities, sensation intact all 4 extremities, plantars down going.  4. Eyes :  anicteric sclerae, moist conjunctivae with no lid lag. PERRLA.  5. ENMT:  Oropharynx clear with moist mucous membranes and good dentition  6. Neck:  supple, no cervical lymphadenopathy appriciated, No thyromegaly  7. Respiratory : Normal respiratory effort, good air movement bilaterally,clear to  auscultation bilaterally  8. Cardiovascular : RRR, no gallops, rubs or murmurs, no leg edema  9. Gastrointestinal:  Positive bowel sounds, abdomen soft, non-tender to palpation,no hepatosplenomegaly, no rigidity or guarding       10. Skin:              Data Review:    CBC  Recent Labs Lab 07/13/16 1844  WBC 10.5  HGB 8.7*  HCT 25.5*  PLT 250  MCV 88.2  MCH 30.1  MCHC 34.1  RDW 12.6  LYMPHSABS 1.0  MONOABS 0.4  EOSABS 0.0  BASOSABS 0.0   ------------------------------------------------------------------------------------------------------------------  Chemistries   Recent Labs Lab 07/13/16 1844  NA 132*  K 5.0  CL 101  CO2 22  GLUCOSE 377*  BUN 37*  CREATININE 1.98*    CALCIUM 8.6*  AST 16  ALT 12*  ALKPHOS 66  BILITOT 0.7   ------------------------------------------------------------------------------------------------------------------  ------------------------------------------------------------------------------------------------------------------ GFR: Estimated Creatinine Clearance: 27.6 mL/min (by C-G formula based on SCr of 1.98 mg/dL (H)). Liver Function Tests:  Recent Labs Lab 07/13/16 1844  AST 16  ALT 12*  ALKPHOS 66  BILITOT 0.7  PROT 7.4  ALBUMIN  2.6*    --------------------------------------------------------------------------------------------------------------- Urine analysis: No results found for: COLORURINE, APPEARANCEUR, LABSPEC, PHURINE, GLUCOSEU, HGBUR, BILIRUBINUR, KETONESUR, PROTEINUR, UROBILINOGEN, NITRITE, LEUKOCYTESUR    Imaging Results:    Dg Foot Complete Right  Result Date: 07/13/2016 CLINICAL DATA:  Great toe infection EXAM: RIGHT FOOT COMPLETE - 3+ VIEW COMPARISON:  None. FINDINGS: There is subcutaneous gas about the proximal phalanx first digit and distal interphalangeal joint first digit consistent with bacterial infection. No clear osseous erosion identified. No extension of gas beyond the proximal phalanx. IMPRESSION: 1. Subcutaneous along the great toe consistent with gas-forming bacteria. Recommend surgical consultation. 2. No clear evidence of osteomyelitis. Electronically Signed   By: Suzy Bouchard M.D.   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 Diabetic wet gangrene of foot- will start patient on vancomycin, clindamycin and Zosyn. Will obtain wound cultures, blood cultures have been obtained the ED. ED physician is going to consult orthopedic surgeon. Patient would likely need surgical limitation. Will keep her nothing by mouth after midnight 2. Diabetes mellitus- poorly controlled  diabetes mellitus, blood glucose 377. Will start Lantus 25 units subcutaneous daily, sliding scale insulin with NovoLog. 3. Acute kidney injury-patient is on Diovan HCT, today creatinine 1.98. Her baseline creatinine as of 2016 was 1.18. Start gentle IV hydration with normal saline at 75 mL per hour. 4. Hypertension-hold HCTZ/Diovan, will start hydralazine 25 mg by mouth every 6 hours when necessary for BP greater than 160/100.   DVT Prophylaxis-   Lovenox  AM Labs Ordered, also please review Full Orders  Family Communication: Admission, patients condition and plan of care including tests being ordered have been discussed with the patient and her husband at bedside* who indicate understanding and agree with the plan and Code Status.  Code Status: Full code  Admission status observation  Time spent in minutes : 60 minutes   Sparsh Callens S M.D on 07/13/2016 at 8:43 PM  Between 7am to 7pm - Pager - [phone]. After 7pm go to [email or web address] - password [facility]  [facility] - Office  [phone]

## 2016-07-14 ENCOUNTER — Observation Stay (HOSPITAL_COMMUNITY): Payer: BC Managed Care – PPO

## 2016-07-14 DIAGNOSIS — D638 Anemia in other chronic diseases classified elsewhere: Secondary | ICD-10-CM | POA: Diagnosis present

## 2016-07-14 DIAGNOSIS — I10 Essential (primary) hypertension: Secondary | ICD-10-CM | POA: Diagnosis not present

## 2016-07-14 DIAGNOSIS — N179 Acute kidney failure, unspecified: Secondary | ICD-10-CM | POA: Diagnosis present

## 2016-07-14 DIAGNOSIS — L03115 Cellulitis of right lower limb: Secondary | ICD-10-CM | POA: Diagnosis present

## 2016-07-14 DIAGNOSIS — E1165 Type 2 diabetes mellitus with hyperglycemia: Secondary | ICD-10-CM | POA: Diagnosis present

## 2016-07-14 DIAGNOSIS — Z79899 Other long term (current) drug therapy: Secondary | ICD-10-CM | POA: Diagnosis not present

## 2016-07-14 DIAGNOSIS — E1122 Type 2 diabetes mellitus with diabetic chronic kidney disease: Secondary | ICD-10-CM | POA: Diagnosis present

## 2016-07-14 DIAGNOSIS — L03039 Cellulitis of unspecified toe: Secondary | ICD-10-CM | POA: Diagnosis present

## 2016-07-14 DIAGNOSIS — E1152 Type 2 diabetes mellitus with diabetic peripheral angiopathy with gangrene: Secondary | ICD-10-CM | POA: Diagnosis present

## 2016-07-14 DIAGNOSIS — E860 Dehydration: Secondary | ICD-10-CM | POA: Diagnosis present

## 2016-07-14 DIAGNOSIS — N183 Chronic kidney disease, stage 3 unspecified: Secondary | ICD-10-CM | POA: Diagnosis present

## 2016-07-14 DIAGNOSIS — Z794 Long term (current) use of insulin: Secondary | ICD-10-CM | POA: Diagnosis not present

## 2016-07-14 DIAGNOSIS — I96 Gangrene, not elsewhere classified: Secondary | ICD-10-CM | POA: Diagnosis present

## 2016-07-14 DIAGNOSIS — D649 Anemia, unspecified: Secondary | ICD-10-CM

## 2016-07-14 DIAGNOSIS — E785 Hyperlipidemia, unspecified: Secondary | ICD-10-CM | POA: Diagnosis present

## 2016-07-14 DIAGNOSIS — M7989 Other specified soft tissue disorders: Secondary | ICD-10-CM | POA: Diagnosis present

## 2016-07-14 DIAGNOSIS — E1142 Type 2 diabetes mellitus with diabetic polyneuropathy: Secondary | ICD-10-CM | POA: Diagnosis present

## 2016-07-14 DIAGNOSIS — I129 Hypertensive chronic kidney disease with stage 1 through stage 4 chronic kidney disease, or unspecified chronic kidney disease: Secondary | ICD-10-CM | POA: Diagnosis present

## 2016-07-14 DIAGNOSIS — E559 Vitamin D deficiency, unspecified: Secondary | ICD-10-CM | POA: Diagnosis present

## 2016-07-14 LAB — COMPREHENSIVE METABOLIC PANEL
ALK PHOS: 57 U/L (ref 38–126)
ALT: 11 U/L — AB (ref 14–54)
AST: 13 U/L — AB (ref 15–41)
Albumin: 2.3 g/dL — ABNORMAL LOW (ref 3.5–5.0)
Anion gap: 8 (ref 5–15)
BUN: 34 mg/dL — AB (ref 6–20)
CALCIUM: 8.3 mg/dL — AB (ref 8.9–10.3)
CO2: 23 mmol/L (ref 22–32)
CREATININE: 1.7 mg/dL — AB (ref 0.44–1.00)
Chloride: 101 mmol/L (ref 101–111)
GFR, EST AFRICAN AMERICAN: 36 mL/min — AB (ref >60)
GFR, EST NON AFRICAN AMERICAN: 31 mL/min — AB (ref >60)
Glucose, Bld: 330 mg/dL — ABNORMAL HIGH (ref 65–99)
Potassium: 4.6 mmol/L (ref 3.5–5.1)
Sodium: 132 mmol/L — ABNORMAL LOW (ref 135–145)
Total Bilirubin: 0.4 mg/dL (ref 0.3–1.2)
Total Protein: 6 g/dL — ABNORMAL LOW (ref 6.5–8.1)

## 2016-07-14 LAB — CBC
HEMATOCRIT: 22 % — AB (ref 36.0–46.0)
HEMOGLOBIN: 7.6 g/dL — AB (ref 12.0–15.0)
MCH: 30.5 pg (ref 26.0–34.0)
MCHC: 34.5 g/dL (ref 30.0–36.0)
MCV: 88.4 fL (ref 78.0–100.0)
Platelets: 215 10*3/uL (ref 150–400)
RBC: 2.49 MIL/uL — AB (ref 3.87–5.11)
RDW: 12.6 % (ref 11.5–15.5)
WBC: 8.5 10*3/uL (ref 4.0–10.5)

## 2016-07-14 LAB — GLUCOSE, CAPILLARY
GLUCOSE-CAPILLARY: 273 mg/dL — AB (ref 65–99)
GLUCOSE-CAPILLARY: 243 mg/dL — AB (ref 65–99)
GLUCOSE-CAPILLARY: 250 mg/dL — AB (ref 65–99)
Glucose-Capillary: 364 mg/dL — ABNORMAL HIGH (ref 65–99)
Glucose-Capillary: 229 mg/dL — ABNORMAL HIGH (ref 65–99)

## 2016-07-14 LAB — RETICULOCYTES
RBC.: 2.52 MIL/uL — ABNORMAL LOW (ref 3.87–5.11)
Retic Count, Absolute: 45.4 10*3/uL (ref 19.0–186.0)
Retic Ct Pct: 1.8 % (ref 0.4–3.1)

## 2016-07-14 LAB — FOLATE: FOLATE: 13.4 ng/mL (ref >5.9)

## 2016-07-14 MED ORDER — ENOXAPARIN SODIUM 40 MG/0.4ML ~~LOC~~ SOLN
40.0000 mg | SUBCUTANEOUS | Status: DC
  Administered 2016-07-14 – 2016-07-18 (×5): 40 mg via SUBCUTANEOUS
  Filled 2016-07-14 (×5): qty 0.4

## 2016-07-14 MED ORDER — CLINDAMYCIN PHOSPHATE 600 MG/50ML IV SOLN
INTRAVENOUS | Status: AC
  Filled 2016-07-14: qty 50

## 2016-07-14 MED ORDER — INSULIN ASPART 100 UNIT/ML ~~LOC~~ SOLN
0.0000 [IU] | Freq: Three times a day (TID) | SUBCUTANEOUS | Status: DC
Start: 2016-07-14 — End: 2016-07-19
  Administered 2016-07-14: 7 [IU] via SUBCUTANEOUS
  Administered 2016-07-15: 4 [IU] via SUBCUTANEOUS
  Administered 2016-07-15: 11 [IU] via SUBCUTANEOUS
  Administered 2016-07-16: 4 [IU] via SUBCUTANEOUS
  Administered 2016-07-16: 7 [IU] via SUBCUTANEOUS
  Administered 2016-07-17: 15 [IU] via SUBCUTANEOUS
  Administered 2016-07-18: 4 [IU] via SUBCUTANEOUS
  Administered 2016-07-18 – 2016-07-19 (×2): 3 [IU] via SUBCUTANEOUS

## 2016-07-14 MED ORDER — INSULIN ASPART 100 UNIT/ML ~~LOC~~ SOLN
0.0000 [IU] | Freq: Every day | SUBCUTANEOUS | Status: DC
  Administered 2016-07-14: 2 [IU] via SUBCUTANEOUS
  Administered 2016-07-15: 3 [IU] via SUBCUTANEOUS
  Administered 2016-07-16: 2 [IU] via SUBCUTANEOUS
  Administered 2016-07-17: 5 [IU] via SUBCUTANEOUS

## 2016-07-14 MED ORDER — INSULIN DETEMIR 100 UNIT/ML ~~LOC~~ SOLN
30.0000 [IU] | Freq: Every evening | SUBCUTANEOUS | Status: DC
  Administered 2016-07-14 – 2016-07-18 (×5): 30 [IU] via SUBCUTANEOUS
  Filled 2016-07-14 (×6): qty 0.3

## 2016-07-14 MED ORDER — INSULIN ASPART 100 UNIT/ML ~~LOC~~ SOLN
4.0000 [IU] | Freq: Three times a day (TID) | SUBCUTANEOUS | Status: DC
Start: 2016-07-14 — End: 2016-07-19
  Administered 2016-07-14 – 2016-07-19 (×8): 4 [IU] via SUBCUTANEOUS

## 2016-07-14 NOTE — Progress Notes (Signed)
Patient ID: Sarah Bridges, female   DOB: [birth year], 66 y.o.   MRN: [id number] CONSULT FOR POSSIBLE AMPUTATION RT GREAT TOE   THE RT GREAT TOE IS GANGRENOUS, SMELLS BAD AND LOOKS WORSE  I WILL PUT IN CONSULT FOR GEN SURGERY

## 2016-07-14 NOTE — Consult Note (Signed)
Reason for Consult: Gangrene, right great toe Referring Physician: Dr. Lucas Mallow MANASVINI Sarah Bridges is an 66 y.o. female.  HPI: Patient is a 66 year old white female with a history of diabetes mellitus, peripheral neuropathy who presents with a several week history of worsening right great toe infection. She states it started out as a fungus underneath the nail. She soon loss the nail. She denies any fever or chills. She has not been followed by a podiatrist for her feet. She does have diabetes but does not watch it closely. She presented to the emergency room was found to have dry gangrene of the right great toe. She was admitted to the hospital for IV antibiotics. She has never had peripheral arterial studies.  Past Medical History:  Diagnosis Date  . Avulsion fracture    right foot  . DM (diabetes mellitus) ([place])    Type II  . Hyperlipidemia   . Hypertension   . Peripheral neuropathy ([place])   . Vitamin D deficiency   . Wears glasses     Past Surgical History:  Procedure Laterality Date  . COLONOSCOPY  01/17/2012   Procedure: COLONOSCOPY;  Surgeon: Daneil Dolin, MD;  [facility];  Service: Endoscopy;  Laterality: N/A;  8:30  . HARDWARE REMOVAL Right 08/21/2014   Procedure: Irrigation and Debridement Right Foot, Removal Fixation and Place Antibiotic Beads;  Surgeon: Newt Minion, MD;  Location: [place];  Service: Orthopedics;  Laterality: Right;  . OPEN REDUCTION INTERNAL FIXATION (ORIF) FOOT LISFRANC FRACTURE Right 07/31/2014   Procedure: OPEN REDUCTION INTERNAL FIXATION (ORIF) FOOT NAVICULAR FRACTURE;  Surgeon: Newt Minion, MD;  Location: [place];  Service: Orthopedics;  Laterality: Right;  . piloninal cyst     age 75  . TONSILLECTOMY    . TUBAL LIGATION  1976    Family History  Problem Relation Age of Onset  . Emphysema Mother     deceased  . Liver disease Neg Hx   . Colon cancer Neg Hx     Social History:  reports that she has never smoked. She has never used  smokeless tobacco. She reports that she does not drink alcohol or use drugs.  Allergies: No Known Allergies  Medications:  Prior to Admission:  Prescriptions Prior to Admission  Medication Sig Dispense Refill Last Dose  . Cholecalciferol (VITAMIN D) 2000 units tablet Take 2,000 Units by mouth daily.   07/13/2016 at Unknown time  . ferrous sulfate 325 (65 FE) MG tablet Take 325 mg by mouth 2 (two) times daily.   07/13/2016 at Unknown time  . glipiZIDE (GLUCOTROL) 10 MG tablet Take 10 mg by mouth 2 (two) times daily.   07/13/2016 at Unknown time  . Insulin Detemir (LEVEMIR FLEXPEN) 100 UNIT/ML Pen Inject 25 Units into the skin every evening.   07/12/2016 at Unknown time  . metFORMIN (GLUCOPHAGE) 500 MG tablet Take 1,000 mg by mouth 2 (two) times daily with a meal.    07/13/2016 at Unknown time  . valsartan-hydrochlorothiazide (DIOVAN-HCT) 320-25 MG tablet Take 1 tablet by mouth daily.   07/13/2016 at Unknown time   Scheduled: . clindamycin (CLEOCIN) IV  600 mg Intravenous Q6H  . enoxaparin (LOVENOX) injection  40 mg Subcutaneous Q24H  . ferrous sulfate  325 mg Oral BID  . insulin aspart  0-9 Units Subcutaneous TID WC  . insulin detemir  25 Units Subcutaneous QPM  . piperacillin-tazobactam (ZOSYN)  IV  3.375 g Intravenous Q8H  . vancomycin  1,000 mg Intravenous  Q24H    Results for orders placed or performed during the hospital encounter of 07/13/16 (from the past 48 hour(s))  Comprehensive metabolic panel     Status: Abnormal   Collection Time: 07/13/16  6:44 PM  Result Value Ref Range   Sodium 132 (L) 135 - 145 mmol/L   Potassium 5.0 3.5 - 5.1 mmol/L   Chloride 101 101 - 111 mmol/L   CO2 22 22 - 32 mmol/L   Glucose, Bld 377 (H) 65 - 99 mg/dL   BUN 37 (H) 6 - 20 mg/dL   Creatinine, Ser 1.98 (H) 0.44 - 1.00 mg/dL   Calcium 8.6 (L) 8.9 - 10.3 mg/dL   Total Protein 7.4 6.5 - 8.1 g/dL   Albumin 2.6 (L) 3.5 - 5.0 g/dL   AST 16 15 - 41 U/L   ALT 12 (L) 14 - 54 U/L   Alkaline Phosphatase 66 38 -  126 U/L   Total Bilirubin 0.7 0.3 - 1.2 mg/dL   GFR calc non Af Amer 26 (L) >60 mL/min   GFR calc Af Amer 30 (L) >60 mL/min    Comment: (NOTE) The eGFR has been calculated using the CKD EPI equation. This calculation has not been validated in all clinical situations. eGFR's persistently <60 mL/min signify possible Chronic Kidney Disease.    Anion gap 9 5 - 15  CBC with Differential     Status: Abnormal   Collection Time: 07/13/16  6:44 PM  Result Value Ref Range   WBC 10.5 4.0 - 10.5 K/uL   RBC 2.89 (L) 3.87 - 5.11 MIL/uL   Hemoglobin 8.7 (L) 12.0 - 15.0 g/dL   HCT 25.5 (L) 36.0 - 46.0 %   MCV 88.2 78.0 - 100.0 fL   MCH 30.1 26.0 - 34.0 pg   MCHC 34.1 30.0 - 36.0 g/dL   RDW 12.6 11.5 - 15.5 %   Platelets 250 150 - 400 K/uL   Neutrophils Relative % 88 %   Neutro Abs 9.2 (H) 1.7 - 7.7 K/uL   Lymphocytes Relative 9 %   Lymphs Abs 1.0 0.7 - 4.0 K/uL   Monocytes Relative 3 %   Monocytes Absolute 0.4 0.1 - 1.0 K/uL   Eosinophils Relative 0 %   Eosinophils Absolute 0.0 0.0 - 0.7 K/uL   Basophils Relative 0 %   Basophils Absolute 0.0 0.0 - 0.1 K/uL  Lactic acid, plasma     Status: None   Collection Time: 07/13/16  6:49 PM  Result Value Ref Range   Lactic Acid, Venous 1.2 0.5 - 1.9 mmol/L  Blood culture (routine x 2)     Status: None (Preliminary result)   Collection Time: 07/13/16  6:49 PM  Result Value Ref Range   Specimen Description RIGHT ANTECUBITAL    Special Requests BOTTLES DRAWN AEROBIC AND ANAEROBIC 6CC    Culture NO GROWTH < 12 HOURS    Report Status PENDING   Blood culture (routine x 2)     Status: None (Preliminary result)   Collection Time: 07/13/16  6:49 PM  Result Value Ref Range   Specimen Description LEFT ANTECUBITAL    Special Requests BOTTLES DRAWN AEROBIC AND ANAEROBIC 6CC    Culture NO GROWTH < 12 HOURS    Report Status PENDING   C-reactive protein     Status: Abnormal   Collection Time: 07/13/16  6:50 PM  Result Value Ref Range   CRP 19.9 (H) <1.0  mg/dL    Comment: Performed at [facility]  Lab, [address] [place], [place] [address]  Glucose, capillary     Status: Abnormal   Collection Time: 07/13/16  9:57 PM  Result Value Ref Range   Glucose-Capillary 401 (H) 65 - 99 mg/dL   Comment 1 Notify RN    Comment 2 Document in Chart   Glucose, random     Status: Abnormal   Collection Time: 07/13/16 10:10 PM  Result Value Ref Range   Glucose, Bld 373 (H) 65 - 99 mg/dL  CBC     Status: Abnormal   Collection Time: 07/14/16  4:09 AM  Result Value Ref Range   WBC 8.5 4.0 - 10.5 K/uL   RBC 2.49 (L) 3.87 - 5.11 MIL/uL   Hemoglobin 7.6 (L) 12.0 - 15.0 g/dL   HCT 22.0 (L) 36.0 - 46.0 %   MCV 88.4 78.0 - 100.0 fL   MCH 30.5 26.0 - 34.0 pg   MCHC 34.5 30.0 - 36.0 g/dL   RDW 12.6 11.5 - 15.5 %   Platelets 215 150 - 400 K/uL  Comprehensive metabolic panel     Status: Abnormal   Collection Time: 07/14/16  4:09 AM  Result Value Ref Range   Sodium 132 (L) 135 - 145 mmol/L   Potassium 4.6 3.5 - 5.1 mmol/L   Chloride 101 101 - 111 mmol/L   CO2 23 22 - 32 mmol/L   Glucose, Bld 330 (H) 65 - 99 mg/dL   BUN 34 (H) 6 - 20 mg/dL   Creatinine, Ser 1.70 (H) 0.44 - 1.00 mg/dL   Calcium 8.3 (L) 8.9 - 10.3 mg/dL   Total Protein 6.0 (L) 6.5 - 8.1 g/dL   Albumin 2.3 (L) 3.5 - 5.0 g/dL   AST 13 (L) 15 - 41 U/L   ALT 11 (L) 14 - 54 U/L   Alkaline Phosphatase 57 38 - 126 U/L   Total Bilirubin 0.4 0.3 - 1.2 mg/dL   GFR calc non Af Amer 31 (L) >60 mL/min   GFR calc Af Amer 36 (L) >60 mL/min    Comment: (NOTE) The eGFR has been calculated using the CKD EPI equation. This calculation has not been validated in all clinical situations. eGFR's persistently <60 mL/min signify possible Chronic Kidney Disease.    Anion gap 8 5 - 15  Glucose, capillary     Status: Abnormal   Collection Time: 07/14/16  5:21 AM  Result Value Ref Range   Glucose-Capillary 273 (H) 65 - 99 mg/dL  Glucose, capillary     Status: Abnormal   Collection Time: 07/14/16  7:42  AM  Result Value Ref Range   Glucose-Capillary 243 (H) 65 - 99 mg/dL    Dg Foot Complete Right  Result Date: 07/13/2016 CLINICAL DATA:  Great toe infection EXAM: RIGHT FOOT COMPLETE - 3+ VIEW COMPARISON:  None. FINDINGS: There is subcutaneous gas about the proximal phalanx first digit and distal interphalangeal joint first digit consistent with bacterial infection. No clear osseous erosion identified. No extension of gas beyond the proximal phalanx. IMPRESSION: 1. Subcutaneous along the great toe consistent with gas-forming bacteria. Recommend surgical consultation. 2. No clear evidence of osteomyelitis. Electronically Signed   By: Suzy Bouchard M.D.   On: 07/13/2016 19:51    ROS:  Pertinent items noted in HPI and remainder of comprehensive ROS otherwise negative.  Blood pressure (!) 154/76, pulse 69, temperature 97.6 F (36.4 C), temperature source Oral, resp. rate 20, height _0  (1.575 m), weight 77.8 kg (171 lb 8.3 oz), SpO2  100 %. Physical Exam: Pleasant white female in no acute distress. Head is normocephalic, atraumatic. Neck is supple without JVD Lungs clear auscultation with breath sounds bilaterally. Heart examination reveals a regular rate and rhythm without S3, S4, murmurs. Extremity examination reveals bilateral femoral pulses. There are faintly palpable pulses in the right dorsalis pedis and posterior tibial. A mummified right great toe was present with some erythema at the base. There is a small ischemic eschar present on the second toe with mild erythema and swelling. There is some serous drainage present. There is no ascending cellulitis present. The right foot is not significantly swollen.  Assessment/Plan: Impression: Dry gangrene of right great toe, ischemic changes of the right second toe, uncontrolled diabetes mellitus, peripheral neuropathy, anemia Plan: Patient ultimately will need amputation of the right great and second toes. Her blood sugars need to be under  better control prior to surgery. She will also need a blood transfusion prior to surgery. She does not need emergent surgery as she does not have a descending cellulitis of the right leg. Peripheral arterial segmental Dopplers have been ordered. Surgery is temporarily scheduled for 11/14/2016.  Pinchus Weckwerth A 07/14/2016, 10:53 AM

## 2016-07-14 NOTE — Discharge Summary (Deleted)
PROGRESS NOTE    Sarah SAKSA  GNF:[id number] DOB: [birth year] DOA: 07/13/2016 PCP: [name], DO   Brief Narrative:  66 year old female with history of diabetes, chronic kidney disease stage III and hypertension, presents to the hospital with discoloration and foul smell coming from her right great toe for 2 weeks prior to admission. She was found to have dry gangrene of her right great toe with surrounding cellulitis. She was started on antibiotics and admitted for further treatment.   Assessment & Plan:   Active Problems:   Diabetes mellitus (HCC)   Dry gangrene (HCC)   Hypertension   Anemia   CKD (chronic kidney disease) stage 3, GFR 30-59 ml/min   Gangrene of toe of right foot ([place])   AKI (acute kidney injury) ([place])   1. Dry gangrene of right great toe with surrounding cellulitis. Patient has been started on intravenous antibiotics with vancomycin and Zosyn. Gen. surgery is following and plans on amputation. Continue current treatments  2. Uncontrolled diabetes. Continue on Lantus. Add meal coverage NovoLog. Follow-up A1c .  3. AKI on CKD3. Likely has some degree of dehydration with ongoing infection. Continue IV fluids and follow renal function.  4. Anemia, suspect chronic disease. Patient has not had any obvious bleeding. Check anemia panel. She will likely need to be transfused in preparation for surgery. Continue to follow for now.  5. Hypertension. Patient was taking ARB/hydrochlorothiazide prior to admission. These have been discontinued due to renal failure. Currently on hydralazine when necessary .  DVT prophylaxis: lovenox Code Status: full Family Communication: discussed with patient, no family present Disposition Plan: discharge home once improved   Consultants:   General surgery  Procedures:     Antimicrobials:   Vancomycin 2/8>>  Zosyn 2/8>>   Subjective: No pain in foot. No chest pain or shortness of breath. No history of  bleeding  Objective: Vitals:   07/13/16 1955 07/13/16 2100 07/13/16 2159 07/14/16 0500  BP: 137/65 141/66 (!) 147/65 (!) 154/76  Pulse: 80 79 75 69  Resp: 19  20 20   Temp:   98.7 F (37.1 C) 97.6 F (36.4 C)  TempSrc:   Oral Oral  SpO2: 96% 98% 100% 100%  Weight:   77.8 kg (171 lb 8.3 oz)   Height:   5\' 2"  (1.575 m)     Intake/Output Summary (Last 24 hours) at 07/14/16 1505 Last data filed at 07/14/16 1228  Gross per 24 hour  Intake             1672 ml  Output              700 ml  Net              972 ml   Filed Weights   07/13/16 1754 07/13/16 2159  Weight: 77.1 kg (170 lb) 77.8 kg (171 lb 8.3 oz)    Examination:  General exam: Appears calm and comfortable  Respiratory system: Clear to auscultation. Respiratory effort normal. Cardiovascular system: S1 & S2 heard, RRR. No JVD, murmurs, rubs, gallops or clicks. No pedal edema. Gastrointestinal system: Abdomen is nondistended, soft and nontender. No organomegaly or masses felt. Normal bowel sounds heard. Central nervous system: Alert and oriented. No focal neurological deficits. Extremities: Symmetric 5 x 5 power. Skin: right great toe is gangrenous with surrounding erythema at base with foul smell Psychiatry: Judgement and insight appear normal. Mood & affect appropriate.     Data Reviewed: I have personally reviewed following labs and imaging studies  CBC:  Recent Labs Lab 07/13/16 1844 07/14/16 0409  WBC 10.5 8.5  NEUTROABS 9.2*  --   HGB 8.7* 7.6*  HCT 25.5* 22.0*  MCV 88.2 88.4  PLT 250 268   Basic Metabolic Panel:  Recent Labs Lab 07/13/16 1844 07/13/16 2210 07/14/16 0409  NA 132*  --  132*  K 5.0  --  4.6  CL 101  --  101  CO2 22  --  23  GLUCOSE 377* 373* 330*  BUN 37*  --  34*  CREATININE 1.98*  --  1.70*  CALCIUM 8.6*  --  8.3*   GFR: Estimated Creatinine Clearance: 32.3 mL/min (by C-G formula based on SCr of 1.7 mg/dL (H)). Liver Function Tests:  Recent Labs Lab 07/13/16 1844  07/14/16 0409  AST 16 13*  ALT 12* 11*  ALKPHOS 66 57  BILITOT 0.7 0.4  PROT 7.4 6.0*  ALBUMIN 2.6* 2.3*   No results for input(s): LIPASE, AMYLASE in the last 168 hours. No results for input(s): AMMONIA in the last 168 hours. Coagulation Profile: No results for input(s): INR, PROTIME in the last 168 hours. Cardiac Enzymes: No results for input(s): CKTOTAL, CKMB, CKMBINDEX, TROPONINI in the last 168 hours. BNP (last 3 results) No results for input(s): PROBNP in the last 8760 hours. HbA1C: No results for input(s): HGBA1C in the last 72 hours. CBG:  Recent Labs Lab 07/13/16 2157 07/14/16 0521 07/14/16 0742 07/14/16 1131  GLUCAP 401* 273* 243* 364*   Lipid Profile: No results for input(s): CHOL, HDL, LDLCALC, TRIG, CHOLHDL, LDLDIRECT in the last 72 hours. Thyroid Function Tests: No results for input(s): TSH, T4TOTAL, FREET4, T3FREE, THYROIDAB in the last 72 hours. Anemia Panel: No results for input(s): VITAMINB12, FOLATE, FERRITIN, TIBC, IRON, RETICCTPCT in the last 72 hours. Sepsis Labs:  Recent Labs Lab 07/13/16 1849  LATICACIDVEN 1.2    Recent Results (from the past 240 hour(s))  Blood culture (routine x 2)     Status: None (Preliminary result)   Collection Time: 07/13/16  6:49 PM  Result Value Ref Range Status   Specimen Description RIGHT ANTECUBITAL  Final   Special Requests BOTTLES DRAWN AEROBIC AND ANAEROBIC 6CC  Final   Culture NO GROWTH < 12 HOURS  Final   Report Status PENDING  Incomplete  Blood culture (routine x 2)     Status: None (Preliminary result)   Collection Time: 07/13/16  6:49 PM  Result Value Ref Range Status   Specimen Description LEFT ANTECUBITAL  Final   Special Requests BOTTLES DRAWN AEROBIC AND ANAEROBIC 6CC  Final   Culture NO GROWTH < 12 HOURS  Final   Report Status PENDING  Incomplete         Radiology Studies: US Arterial Seg Single  Result Date: 07/14/2016 CLINICAL DATA:  Dry gangrene of right great toe post fungal nail  infection, ischemic changes right second toe, neuropathy EXAM: NONINVASIVE PHYSIOLOGIC VASCULAR STUDY OF BILATERAL LOWER EXTREMITIES TECHNIQUE: Evaluation of both lower extremities were performed at rest, including calculation of ankle-brachial indices with single level Doppler, pressure and pulse volume recording. COMPARISON:  None. FINDINGS: Right ABI:  1.18 Left ABI:  1.13 Right Lower Extremity: Triphasic waveform in the distal posterior tibial artery, biphasic in dorsalis pedis. Left Lower Extremity:  Triphasic waveforms recorded distally. IMPRESSION: No evidence of hemodynamically significant lower extremity arterial occlusive disease at rest. Electronically Signed   By: Lucrezia Europe M.D.   On: 07/14/2016 13:59   Dg Foot Complete Right  Result Date: 07/13/2016  CLINICAL DATA:  Great toe infection EXAM: RIGHT FOOT COMPLETE - 3+ VIEW COMPARISON:  None. FINDINGS: There is subcutaneous gas about the proximal phalanx first digit and distal interphalangeal joint first digit consistent with bacterial infection. No clear osseous erosion identified. No extension of gas beyond the proximal phalanx. IMPRESSION: 1. Subcutaneous along the great toe consistent with gas-forming bacteria. Recommend surgical consultation. 2. No clear evidence of osteomyelitis. Electronically Signed   By: Suzy Bouchard M.D.   On: 07/13/2016 19:51        Scheduled Meds: . enoxaparin (LOVENOX) injection  40 mg Subcutaneous Q24H  . ferrous sulfate  325 mg Oral BID  . insulin aspart  0-20 Units Subcutaneous TID WC  . insulin aspart  0-5 Units Subcutaneous QHS  . insulin aspart  4 Units Subcutaneous TID WC  . insulin detemir  30 Units Subcutaneous QPM  . piperacillin-tazobactam (ZOSYN)  IV  3.375 g Intravenous Q8H  . vancomycin  1,000 mg Intravenous Q24H   Continuous Infusions: . sodium chloride 75 mL/hr at 07/14/16 0503     LOS: 0 days    Time spent: 33mins    Katey Barrie, MD [facility] Pager  [phone]  If 7PM-7AM, please contact night-coverage [email or web address] Password [email or web address] 07/14/2016, 3:05 PM

## 2016-07-14 NOTE — Progress Notes (Signed)
Inpatient Diabetes Program Recommendations  AACE/ADA: New Consensus Statement on Inpatient Glycemic Control (2015)  Target Ranges:  Prepandial:   less than 140 mg/dL      Peak postprandial:   less than 180 mg/dL (1-2 hours)      Critically ill patients:  140 - 180 mg/dL   Results for RAILEY, GLAD (MRN [id number]) as of 07/14/2016 10:12  Ref. Range 07/13/2016 21:57 07/14/2016 05:21 07/14/2016 07:42  Glucose-Capillary Latest Ref Range: 65 - 99 mg/dL 401 (H) 273 (H) 243 (H)   Review of Glycemic Control  Diabetes history: DM 2 Outpatient Diabetes medications: Levemir 25, Metformin 1000 mg BID, Glipizide 10 BID Current orders for Inpatient glycemic control: Levemir 25 units, Novolog Sensitive TID  A1c in process  Inpatient Diabetes Program Recommendations:   Glucose in the mid 200's after Levemir 25 units given last night. Please consider increasing Levemir to 30 units.   Thanks,  Tama Headings RN, MSN, [facility] Inpatient Diabetes Coordinator Team Pager [phone] (8a-5p)

## 2016-07-15 LAB — BLOOD CULTURE ID PANEL (REFLEXED)
Acinetobacter baumannii: NOT DETECTED
CANDIDA GLABRATA: NOT DETECTED
CANDIDA KRUSEI: NOT DETECTED
Candida albicans: NOT DETECTED
Candida parapsilosis: NOT DETECTED
Candida tropicalis: NOT DETECTED
ENTEROBACTER CLOACAE COMPLEX: NOT DETECTED
Enterobacteriaceae species: NOT DETECTED
Enterococcus species: NOT DETECTED
Escherichia coli: NOT DETECTED
Haemophilus influenzae: NOT DETECTED
KLEBSIELLA OXYTOCA: NOT DETECTED
Klebsiella pneumoniae: NOT DETECTED
Listeria monocytogenes: NOT DETECTED
Neisseria meningitidis: NOT DETECTED
PROTEUS SPECIES: NOT DETECTED
PSEUDOMONAS AERUGINOSA: NOT DETECTED
STAPHYLOCOCCUS SPECIES: NOT DETECTED
STREPTOCOCCUS PNEUMONIAE: NOT DETECTED
STREPTOCOCCUS PYOGENES: NOT DETECTED
Serratia marcescens: NOT DETECTED
Staphylococcus aureus (BCID): NOT DETECTED
Streptococcus agalactiae: NOT DETECTED
Streptococcus species: DETECTED — AB

## 2016-07-15 LAB — BASIC METABOLIC PANEL
ANION GAP: 7 (ref 5–15)
BUN: 23 mg/dL — ABNORMAL HIGH (ref 6–20)
CALCIUM: 8.3 mg/dL — AB (ref 8.9–10.3)
CHLORIDE: 105 mmol/L (ref 101–111)
CO2: 24 mmol/L (ref 22–32)
CREATININE: 1.5 mg/dL — AB (ref 0.44–1.00)
GFR calc Af Amer: 41 mL/min — ABNORMAL LOW (ref >60)
GFR calc non Af Amer: 36 mL/min — ABNORMAL LOW (ref >60)
GLUCOSE: 126 mg/dL — AB (ref 65–99)
Potassium: 4.2 mmol/L (ref 3.5–5.1)
Sodium: 136 mmol/L (ref 135–145)

## 2016-07-15 LAB — CBC
HEMATOCRIT: 22.8 % — AB (ref 36.0–46.0)
HEMOGLOBIN: 8.2 g/dL — AB (ref 12.0–15.0)
MCH: 31.9 pg (ref 26.0–34.0)
MCHC: 36 g/dL (ref 30.0–36.0)
MCV: 88.7 fL (ref 78.0–100.0)
Platelets: 234 10*3/uL (ref 150–400)
RBC: 2.57 MIL/uL — ABNORMAL LOW (ref 3.87–5.11)
RDW: 12.7 % (ref 11.5–15.5)
WBC: 6.6 10*3/uL (ref 4.0–10.5)

## 2016-07-15 LAB — GLUCOSE, CAPILLARY
GLUCOSE-CAPILLARY: 243 mg/dL — AB (ref 65–99)
Glucose-Capillary: 116 mg/dL — ABNORMAL HIGH (ref 65–99)
Glucose-Capillary: 195 mg/dL — ABNORMAL HIGH (ref 65–99)
Glucose-Capillary: 275 mg/dL — ABNORMAL HIGH (ref 65–99)

## 2016-07-15 LAB — VITAMIN B12: Vitamin B-12: 115 pg/mL — ABNORMAL LOW (ref 180–914)

## 2016-07-15 LAB — FERRITIN: Ferritin: 768 ng/mL — ABNORMAL HIGH (ref 11–307)

## 2016-07-15 LAB — PREPARE RBC (CROSSMATCH)

## 2016-07-15 LAB — HEMOGLOBIN A1C
HEMOGLOBIN A1C: 8.1 % — AB (ref 4.8–5.6)
MEAN PLASMA GLUCOSE: 186 mg/dL

## 2016-07-15 LAB — ABO/RH: ABO/RH(D): B NEG

## 2016-07-15 LAB — IRON AND TIBC
IRON: 37 ug/dL (ref 28–170)
Saturation Ratios: 22 % (ref 10.4–31.8)
TIBC: 165 ug/dL — AB (ref 250–450)
UIBC: 128 ug/dL

## 2016-07-15 MED ORDER — SODIUM CHLORIDE 0.9 % IV SOLN: Freq: Once | INTRAVENOUS | Status: DC

## 2016-07-15 NOTE — Progress Notes (Signed)
PROGRESS NOTE    Sarah Bridges  ZES:[id number] DOB: [birth year] DOA: 07/13/2016 PCP: [name], DO   Brief Narrative:  66 year old female with history of diabetes, chronic kidney disease stage III and hypertension, presents to the hospital with discoloration and foul smell coming from her right great toe for 2 weeks prior to admission. She was found to have dry gangrene of her right great toe with surrounding cellulitis. She was started on antibiotics and admitted for further treatment.   Assessment & Plan:   Active Problems:   Diabetes mellitus (HCC)   Dry gangrene (HCC)   Hypertension   Anemia   CKD (chronic kidney disease) stage 3, GFR 30-59 ml/min   Gangrene of toe of right foot ([place])   AKI (acute kidney injury) ([place])   1. Dry gangrene of right great toe with surrounding cellulitis. Patient has been started on intravenous antibiotics with vancomycin and Zosyn. Gen. surgery is following and plans on amputation. She has 1 out of 2 positive blood cultures for Streptococcus. Follow-up further identification/sensitivities. Continue current treatments  2. Uncontrolled diabetes. Continue on Lantus and NovoLog. A1c is 8.1. Blood sugars have improved since admission..  3. AKI on CKD3. Likely has some degree of dehydration with ongoing infection. Improving with hydration. Continue to follow.  4. Anemia, likely related to chronic disease from her diabetic wound. Patient has not had any obvious bleeding. She'll be transfused 2 units of PRBCs today in anticipation of surgery.  5. Hypertension. Patient was taking ARB/hydrochlorothiazide prior to admission. These have been discontinued due to renal failure. Currently on hydralazine when necessary .  DVT prophylaxis: lovenox Code Status: full Family Communication: discussed with patient, no family present Disposition Plan: discharge home once improved   Consultants:   General surgery  Procedures:     Antimicrobials:    Vancomycin 2/8>>  Zosyn 2/8>>   Subjective: No nausea, vomiting. No shortness of breath.  Objective: Vitals:   07/14/16 1617 07/14/16 2217 07/15/16 0544 07/15/16 1541  BP: (!) 150/65 (!) 148/66 (!) 154/61 (!) 175/70  Pulse: 65 64 72 71  Resp: 16 16 16 16   Temp: 98.2 F (36.8 C) 98 F (36.7 C) 98.1 F (36.7 C) 97.9 F (36.6 C)  TempSrc: Oral Oral Oral Oral  SpO2: 100% 100% 100% 98%  Weight:   77.7 kg (171 lb 6.2 oz)   Height:        Intake/Output Summary (Last 24 hours) at 07/15/16 1602 Last data filed at 07/15/16 1543  Gross per 24 hour  Intake           1652.5 ml  Output             1500 ml  Net            152.5 ml   Filed Weights   07/13/16 1754 07/13/16 2159 07/15/16 0544  Weight: 77.1 kg (170 lb) 77.8 kg (171 lb 8.3 oz) 77.7 kg (171 lb 6.2 oz)    Examination:  General exam: Appears calm and comfortable  Respiratory system: Clear to auscultation. Respiratory effort normal. Cardiovascular system: S1 & S2 heard, RRR. No JVD, murmurs, rubs, gallops or clicks. No pedal edema. Gastrointestinal system: Abdomen is nondistended, soft and nontender. No organomegaly or masses felt. Normal bowel sounds heard. Central nervous system: Alert and oriented. No focal neurological deficits. Extremities: Symmetric 5 x 5 power. Skin: right great toe is gangrenous with surrounding erythema at base with foul smell Psychiatry: Judgement and insight appear normal. Mood & affect  appropriate.     Data Reviewed: I have personally reviewed following labs and imaging studies  CBC:  Recent Labs Lab 07/13/16 1844 07/14/16 0409 07/15/16 0544  WBC 10.5 8.5 6.6  NEUTROABS 9.2*  --   --   HGB 8.7* 7.6* 8.2*  HCT 25.5* 22.0* 22.8*  MCV 88.2 88.4 88.7  PLT 250 215 765   Basic Metabolic Panel:  Recent Labs Lab 07/13/16 1844 07/13/16 2210 07/14/16 0409 07/15/16 0544  NA 132*  --  132* 136  K 5.0  --  4.6 4.2  CL 101  --  101 105  CO2 22  --  23 24  GLUCOSE 377* 373* 330*  126*  BUN 37*  --  34* 23*  CREATININE 1.98*  --  1.70* 1.50*  CALCIUM 8.6*  --  8.3* 8.3*   GFR: Estimated Creatinine Clearance: 36.5 mL/min (by C-G formula based on SCr of 1.5 mg/dL (H)). Liver Function Tests:  Recent Labs Lab 07/13/16 1844 07/14/16 0409  AST 16 13*  ALT 12* 11*  ALKPHOS 66 57  BILITOT 0.7 0.4  PROT 7.4 6.0*  ALBUMIN 2.6* 2.3*   No results for input(s): LIPASE, AMYLASE in the last 168 hours. No results for input(s): AMMONIA in the last 168 hours. Coagulation Profile: No results for input(s): INR, PROTIME in the last 168 hours. Cardiac Enzymes: No results for input(s): CKTOTAL, CKMB, CKMBINDEX, TROPONINI in the last 168 hours. BNP (last 3 results) No results for input(s): PROBNP in the last 8760 hours. HbA1C:  Recent Labs  07/13/16 1844  HGBA1C 8.1*   CBG:  Recent Labs Lab 07/14/16 1131 07/14/16 1614 07/14/16 2108 07/15/16 0752 07/15/16 1110  GLUCAP 364* 250* 229* 116* 195*   Lipid Profile: No results for input(s): CHOL, HDL, LDLCALC, TRIG, CHOLHDL, LDLDIRECT in the last 72 hours. Thyroid Function Tests: No results for input(s): TSH, T4TOTAL, FREET4, T3FREE, THYROIDAB in the last 72 hours. Anemia Panel:  Recent Labs  07/14/16 1528  VITAMINB12 115*  FOLATE 13.4  FERRITIN 768*  TIBC 165*  IRON 37  RETICCTPCT 1.8   Sepsis Labs:  Recent Labs Lab 07/13/16 1849  LATICACIDVEN 1.2    Recent Results (from the past 240 hour(s))  Blood culture (routine x 2)     Status: None (Preliminary result)   Collection Time: 07/13/16  6:49 PM  Result Value Ref Range Status   Specimen Description RIGHT ANTECUBITAL  Final   Special Requests BOTTLES DRAWN AEROBIC AND ANAEROBIC 6CC  Final   Culture NO GROWTH < 12 HOURS  Final   Report Status PENDING  Incomplete  Blood culture (routine x 2)     Status: None (Preliminary result)   Collection Time: 07/13/16  6:49 PM  Result Value Ref Range Status   Specimen Description LEFT ANTECUBITAL  Final    Special Requests BOTTLES DRAWN AEROBIC AND ANAEROBIC 6CC  Final   Culture  Setup Time   Final    GRAM POSITIVE COCCI Gram Stain Report Called to,Read Back By and Verified With: NURSE RENEA AT 2257 ON 07/14/2016 BY EVA ANAEROBIC BOTTLE ONLY Performed at [facility] Organism ID to follow CRITICAL RESULT CALLED TO, READ BACK BY AND VERIFIED WITH: T HANDY,RN @0533  07/15/16 MKELLY,MLT Performed at [facility], [place] [address], [place], [place] [address]    Culture PENDING  Incomplete   Report Status PENDING  Incomplete  Blood Culture ID Panel (Reflexed)     Status: Abnormal   Collection Time: 07/13/16  6:49 PM  Result Value Ref Range Status   Enterococcus species NOT DETECTED NOT DETECTED Final   Listeria monocytogenes NOT DETECTED NOT DETECTED Final   Staphylococcus species NOT DETECTED NOT DETECTED Final   Staphylococcus aureus NOT DETECTED NOT DETECTED Final   Streptococcus species DETECTED (A) NOT DETECTED Final    Comment: Not Enterococcus species, Streptococcus agalactiae, Streptococcus pyogenes, or Streptococcus pneumoniae. CRITICAL RESULT CALLED TO, READ BACK BY AND VERIFIED WITH: T HANDY,RN @02 /10/18 MKELLY,MLT    Streptococcus agalactiae NOT DETECTED NOT DETECTED Final   Streptococcus pneumoniae NOT DETECTED NOT DETECTED Final   Streptococcus pyogenes NOT DETECTED NOT DETECTED Final   Acinetobacter baumannii NOT DETECTED NOT DETECTED Final   Enterobacteriaceae species NOT DETECTED NOT DETECTED Final   Enterobacter cloacae complex NOT DETECTED NOT DETECTED Final   Escherichia coli NOT DETECTED NOT DETECTED Final   Klebsiella oxytoca NOT DETECTED NOT DETECTED Final   Klebsiella pneumoniae NOT DETECTED NOT DETECTED Final   Proteus species NOT DETECTED NOT DETECTED Final   Serratia marcescens NOT DETECTED NOT DETECTED Final   Haemophilus influenzae NOT DETECTED NOT DETECTED Final   Neisseria meningitidis NOT DETECTED NOT DETECTED Final   Pseudomonas aeruginosa NOT  DETECTED NOT DETECTED Final   Candida albicans NOT DETECTED NOT DETECTED Final   Candida glabrata NOT DETECTED NOT DETECTED Final   Candida krusei NOT DETECTED NOT DETECTED Final   Candida parapsilosis NOT DETECTED NOT DETECTED Final   Candida tropicalis NOT DETECTED NOT DETECTED Final    Comment: Performed at [facility], [place] [address] [place], [place] [address]         Radiology Studies: US Arterial Seg Single  Result Date: 07/14/2016 CLINICAL DATA:  Dry gangrene of right great toe post fungal nail infection, ischemic changes right second toe, neuropathy EXAM: NONINVASIVE PHYSIOLOGIC VASCULAR STUDY OF BILATERAL LOWER EXTREMITIES TECHNIQUE: Evaluation of both lower extremities were performed at rest, including calculation of ankle-brachial indices with single level Doppler, pressure and pulse volume recording. COMPARISON:  None. FINDINGS: Right ABI:  1.18 Left ABI:  1.13 Right Lower Extremity: Triphasic waveform in the distal posterior tibial artery, biphasic in dorsalis pedis. Left Lower Extremity:  Triphasic waveforms recorded distally. IMPRESSION: No evidence of hemodynamically significant lower extremity arterial occlusive disease at rest. Electronically Signed   By: Lucrezia Europe M.D.   On: 07/14/2016 13:59   Dg Foot Complete Right  Result Date: 07/13/2016 CLINICAL DATA:  Great toe infection EXAM: RIGHT FOOT COMPLETE - 3+ VIEW COMPARISON:  None. FINDINGS: There is subcutaneous gas about the proximal phalanx first digit and distal interphalangeal joint first digit consistent with bacterial infection. No clear osseous erosion identified. No extension of gas beyond the proximal phalanx. IMPRESSION: 1. Subcutaneous along the great toe consistent with gas-forming bacteria. Recommend surgical consultation. 2. No clear evidence of osteomyelitis. Electronically Signed   By: Suzy Bouchard M.D.   On: 07/13/2016 19:51        Scheduled Meds: . sodium chloride   Intravenous Once  .  enoxaparin (LOVENOX) injection  40 mg Subcutaneous Q24H  . ferrous sulfate  325 mg Oral BID  . insulin aspart  0-20 Units Subcutaneous TID WC  . insulin aspart  0-5 Units Subcutaneous QHS  . insulin aspart  4 Units Subcutaneous TID WC  . insulin detemir  30 Units Subcutaneous QPM  . piperacillin-tazobactam (ZOSYN)  IV  3.375 g Intravenous Q8H  . vancomycin  1,000 mg Intravenous Q24H   Continuous Infusions: . sodium chloride 75 mL/hr at 07/15/16 [phone]  LOS: 1 day    Time spent: 39mins    MEMON,JEHANZEB, MD [facility] Pager [phone]  If 7PM-7AM, please contact night-coverage [email or web address] Password [email or web address] 07/15/2016, 4:02 PM

## 2016-07-15 NOTE — Progress Notes (Signed)
PROGRESS NOTE    Sarah Bridges  SAY:[id number] DOB: 1951/03/19 DOA: 07/13/2016 PCP: [name], DO   Brief Narrative:  66 year old female with history of diabetes, chronic kidney disease stage III and hypertension, presents to the hospital with discoloration and foul smell coming from her right great toe for 2 weeks prior to admission. She was found to have dry gangrene of her right great toe with surrounding cellulitis. She was started on antibiotics and admitted for further treatment.   Assessment & Plan:   Active Problems:   Diabetes mellitus (HCC)   Dry gangrene (HCC)   Hypertension   Anemia   CKD (chronic kidney disease) stage 3, GFR 30-59 ml/min   Gangrene of toe of right foot ([place])   AKI (acute kidney injury) ([place])   1. Dry gangrene of right great toe with surrounding cellulitis. Patient has been started on intravenous antibiotics with vancomycin and Zosyn. Gen. surgery is following and plans on amputation. Continue current treatments  2. Uncontrolled diabetes. Continue on Lantus. Add meal coverage NovoLog. Follow-up A1c .  3. AKI on CKD3. Likely has some degree of dehydration with ongoing infection. Continue IV fluids and follow renal function.  4. Anemia, suspect chronic disease. Patient has not had any obvious bleeding. Check anemia panel. She will likely need to be transfused in preparation for surgery. Continue to follow for now.  5. Hypertension. Patient was taking ARB/hydrochlorothiazide prior to admission. These have been discontinued due to renal failure. Currently on hydralazine when necessary .  DVT prophylaxis: lovenox Code Status: full Family Communication: discussed with patient, no family present Disposition Plan: discharge home once improved   Consultants:   General surgery  Procedures:     Antimicrobials:   Vancomycin 2/8>>  Zosyn 2/8>>   Subjective: No pain in foot. No chest pain or shortness of breath. No history of  bleeding  Objective: Vitals:   07/14/16 0500 07/14/16 1617 07/14/16 2217 07/15/16 0544  BP: (!) 154/76 (!) 150/65 (!) 148/66 (!) 154/61  Pulse: 69 65 64 72  Resp: 20 16 16 16   Temp: 97.6 F (36.4 C) 98.2 F (36.8 C) 98 F (36.7 C) 98.1 F (36.7 C)  TempSrc: Oral Oral Oral Oral  SpO2: 100% 100% 100% 100%  Weight:    77.7 kg (171 lb 6.2 oz)  Height:        Intake/Output Summary (Last 24 hours) at 07/15/16 0911 Last data filed at 07/15/16 0754  Gross per 24 hour  Intake           1602.5 ml  Output             1400 ml  Net            202.5 ml   Filed Weights   07/13/16 1754 07/13/16 2159 07/15/16 0544  Weight: 77.1 kg (170 lb) 77.8 kg (171 lb 8.3 oz) 77.7 kg (171 lb 6.2 oz)    Examination:  General exam: Appears calm and comfortable  Respiratory system: Clear to auscultation. Respiratory effort normal. Cardiovascular system: S1 & S2 heard, RRR. No JVD, murmurs, rubs, gallops or clicks. No pedal edema. Gastrointestinal system: Abdomen is nondistended, soft and nontender. No organomegaly or masses felt. Normal bowel sounds heard. Central nervous system: Alert and oriented. No focal neurological deficits. Extremities: Symmetric 5 x 5 power. Skin: right great toe is gangrenous with surrounding erythema at base with foul smell Psychiatry: Judgement and insight appear normal. Mood & affect appropriate.     Data Reviewed: I  have personally reviewed following labs and imaging studies  CBC:  Recent Labs Lab 07/13/16 1844 07/14/16 0409 07/15/16 0544  WBC 10.5 8.5 6.6  NEUTROABS 9.2*  --   --   HGB 8.7* 7.6* 8.2*  HCT 25.5* 22.0* 22.8*  MCV 88.2 88.4 88.7  PLT 250 215 630   Basic Metabolic Panel:  Recent Labs Lab 07/13/16 1844 07/13/16 2210 07/14/16 0409 07/15/16 0544  NA 132*  --  132* 136  K 5.0  --  4.6 4.2  CL 101  --  101 105  CO2 22  --  23 24  GLUCOSE 377* 373* 330* 126*  BUN 37*  --  34* 23*  CREATININE 1.98*  --  1.70* 1.50*  CALCIUM 8.6*  --  8.3*  8.3*   GFR: Estimated Creatinine Clearance: 36.5 mL/min (by C-G formula based on SCr of 1.5 mg/dL (H)). Liver Function Tests:  Recent Labs Lab 07/13/16 1844 07/14/16 0409  AST 16 13*  ALT 12* 11*  ALKPHOS 66 57  BILITOT 0.7 0.4  PROT 7.4 6.0*  ALBUMIN 2.6* 2.3*   No results for input(s): LIPASE, AMYLASE in the last 168 hours. No results for input(s): AMMONIA in the last 168 hours. Coagulation Profile: No results for input(s): INR, PROTIME in the last 168 hours. Cardiac Enzymes: No results for input(s): CKTOTAL, CKMB, CKMBINDEX, TROPONINI in the last 168 hours. BNP (last 3 results) No results for input(s): PROBNP in the last 8760 hours. HbA1C:  Recent Labs  07/13/16 1844  HGBA1C 8.1*   CBG:  Recent Labs Lab 07/14/16 0742 07/14/16 1131 07/14/16 1614 07/14/16 2108 07/15/16 0752  GLUCAP 243* 364* 250* 229* 116*   Lipid Profile: No results for input(s): CHOL, HDL, LDLCALC, TRIG, CHOLHDL, LDLDIRECT in the last 72 hours. Thyroid Function Tests: No results for input(s): TSH, T4TOTAL, FREET4, T3FREE, THYROIDAB in the last 72 hours. Anemia Panel:  Recent Labs  07/14/16 1528  VITAMINB12 115*  FOLATE 13.4  FERRITIN 768*  TIBC 165*  IRON 37  RETICCTPCT 1.8   Sepsis Labs:  Recent Labs Lab 07/13/16 1849  LATICACIDVEN 1.2    Recent Results (from the past 240 hour(s))  Blood culture (routine x 2)     Status: None (Preliminary result)   Collection Time: 07/13/16  6:49 PM  Result Value Ref Range Status   Specimen Description RIGHT ANTECUBITAL  Final   Special Requests BOTTLES DRAWN AEROBIC AND ANAEROBIC 6CC  Final   Culture NO GROWTH < 12 HOURS  Final   Report Status PENDING  Incomplete  Blood culture (routine x 2)     Status: None (Preliminary result)   Collection Time: 07/13/16  6:49 PM  Result Value Ref Range Status   Specimen Description LEFT ANTECUBITAL  Final   Special Requests BOTTLES DRAWN AEROBIC AND ANAEROBIC 6CC  Final   Culture  Setup Time    Final    GRAM POSITIVE COCCI Gram Stain Report Called to,Read Back By and Verified With: NURSE RENEA AT 2257 ON 07/14/2016 BY EVA ANAEROBIC BOTTLE ONLY Performed at [facility] ID to follow CRITICAL RESULT CALLED TO, READ BACK BY AND VERIFIED WITH: T HANDY,RN @0533  07/15/16 MKELLY,MLT Performed at [facility], [place] [address] [place], [place] [address]    Culture PENDING  Incomplete   Report Status PENDING  Incomplete  Blood Culture ID Panel (Reflexed)     Status: Abnormal   Collection Time: 07/13/16  6:49 PM  Result Value Ref Range Status  Enterococcus species NOT DETECTED NOT DETECTED Final   Listeria monocytogenes NOT DETECTED NOT DETECTED Final   Staphylococcus species NOT DETECTED NOT DETECTED Final   Staphylococcus aureus NOT DETECTED NOT DETECTED Final   Streptococcus species DETECTED (A) NOT DETECTED Final    Comment: Not Enterococcus species, Streptococcus agalactiae, Streptococcus pyogenes, or Streptococcus pneumoniae. CRITICAL RESULT CALLED TO, READ BACK BY AND VERIFIED WITH: T HANDY,RN @02 /10/18 MKELLY,MLT    Streptococcus agalactiae NOT DETECTED NOT DETECTED Final   Streptococcus pneumoniae NOT DETECTED NOT DETECTED Final   Streptococcus pyogenes NOT DETECTED NOT DETECTED Final   Acinetobacter baumannii NOT DETECTED NOT DETECTED Final   Enterobacteriaceae species NOT DETECTED NOT DETECTED Final   Enterobacter cloacae complex NOT DETECTED NOT DETECTED Final   Escherichia coli NOT DETECTED NOT DETECTED Final   Klebsiella oxytoca NOT DETECTED NOT DETECTED Final   Klebsiella pneumoniae NOT DETECTED NOT DETECTED Final   Proteus species NOT DETECTED NOT DETECTED Final   Serratia marcescens NOT DETECTED NOT DETECTED Final   Haemophilus influenzae NOT DETECTED NOT DETECTED Final   Neisseria meningitidis NOT DETECTED NOT DETECTED Final   Pseudomonas aeruginosa NOT DETECTED NOT DETECTED Final   Candida albicans NOT DETECTED NOT DETECTED Final   Candida  glabrata NOT DETECTED NOT DETECTED Final   Candida krusei NOT DETECTED NOT DETECTED Final   Candida parapsilosis NOT DETECTED NOT DETECTED Final   Candida tropicalis NOT DETECTED NOT DETECTED Final    Comment: Performed at [facility], [place] [address] [place], [place] [address]         Radiology Studies: US Arterial Seg Single  Result Date: 07/14/2016 CLINICAL DATA:  Dry gangrene of right great toe post fungal nail infection, ischemic changes right second toe, neuropathy EXAM: NONINVASIVE PHYSIOLOGIC VASCULAR STUDY OF BILATERAL LOWER EXTREMITIES TECHNIQUE: Evaluation of both lower extremities were performed at rest, including calculation of ankle-brachial indices with single level Doppler, pressure and pulse volume recording. COMPARISON:  None. FINDINGS: Right ABI:  1.18 Left ABI:  1.13 Right Lower Extremity: Triphasic waveform in the distal posterior tibial artery, biphasic in dorsalis pedis. Left Lower Extremity:  Triphasic waveforms recorded distally. IMPRESSION: No evidence of hemodynamically significant lower extremity arterial occlusive disease at rest. Electronically Signed   By: Lucrezia Europe M.D.   On: 07/14/2016 13:59   Dg Foot Complete Right  Result Date: 07/13/2016 CLINICAL DATA:  Great toe infection EXAM: RIGHT FOOT COMPLETE - 3+ VIEW COMPARISON:  None. FINDINGS: There is subcutaneous gas about the proximal phalanx first digit and distal interphalangeal joint first digit consistent with bacterial infection. No clear osseous erosion identified. No extension of gas beyond the proximal phalanx. IMPRESSION: 1. Subcutaneous along the great toe consistent with gas-forming bacteria. Recommend surgical consultation. 2. No clear evidence of osteomyelitis. Electronically Signed   By: Suzy Bouchard M.D.   On: 07/13/2016 19:51        Scheduled Meds: . enoxaparin (LOVENOX) injection  40 mg Subcutaneous Q24H  . ferrous sulfate  325 mg Oral BID  . insulin aspart  0-20 Units  Subcutaneous TID WC  . insulin aspart  0-5 Units Subcutaneous QHS  . insulin aspart  4 Units Subcutaneous TID WC  . insulin detemir  30 Units Subcutaneous QPM  . piperacillin-tazobactam (ZOSYN)  IV  3.375 g Intravenous Q8H  . vancomycin  1,000 mg Intravenous Q24H   Continuous Infusions: . sodium chloride 75 mL/hr at 07/15/16 0858     LOS: 1 day    Time spent: 72mins  Kathie Dike, MD [facility] Pager [phone]  If 7PM-7AM, please contact night-coverage [email or web address] Password [email or web address] 07/15/2016, 9:11 AM

## 2016-07-15 NOTE — Progress Notes (Signed)
Right great toe and foot washed with warm water and soap per Dr. Arnoldo Morale order. Pt tolerated well. Clean sock placed over site. No dressing needed per MD. Donavan Foil, RN

## 2016-07-15 NOTE — Progress Notes (Signed)
Subjective: No new complaints.  Objective: Vital signs in last 24 hours: Temp:  [98 F (36.7 C)-98.2 F (36.8 C)] 98.1 F (36.7 C) (02/10 0544) Pulse Rate:  [64-72] 72 (02/10 0544) Resp:  [16] 16 (02/10 0544) BP: (148-154)/(61-66) 154/61 (02/10 0544) SpO2:  [100 %] 100 % (02/10 0544) Weight:  [77.7 kg (171 lb 6.2 oz)] 77.7 kg (171 lb 6.2 oz) (02/10 0544) Last BM Date: 07/14/16  Intake/Output from previous day: 02/09 0701 - 02/10 0700 In: 1602.5 [P.O.:780; I.V.:522.5; IV Piggyback:300] Out: 1800 [Urine:1800] Intake/Output this shift: Total I/O In: 240 [P.O.:240] Out: -   General appearance: alert, cooperative and no distress Extremities: No change in right foot. No ascending cellulitis noted.  Lab Results:   Recent Labs  07/14/16 0409 07/15/16 0544  WBC 8.5 6.6  HGB 7.6* 8.2*  HCT 22.0* 22.8*  PLT 215 234   BMET  Recent Labs  07/14/16 0409 07/15/16 0544  NA 132* 136  K 4.6 4.2  CL 101 105  CO2 23 24  GLUCOSE 330* 126*  BUN 34* 23*  CREATININE 1.70* 1.50*  CALCIUM 8.3* 8.3*   PT/INR No results for input(s): LABPROT, INR in the last 72 hours.  Studies/Results: US Arterial Seg Single  Result Date: 07/14/2016 CLINICAL DATA:  Dry gangrene of right great toe post fungal nail infection, ischemic changes right second toe, neuropathy EXAM: NONINVASIVE PHYSIOLOGIC VASCULAR STUDY OF BILATERAL LOWER EXTREMITIES TECHNIQUE: Evaluation of both lower extremities were performed at rest, including calculation of ankle-brachial indices with single level Doppler, pressure and pulse volume recording. COMPARISON:  None. FINDINGS: Right ABI:  1.18 Left ABI:  1.13 Right Lower Extremity: Triphasic waveform in the distal posterior tibial artery, biphasic in dorsalis pedis. Left Lower Extremity:  Triphasic waveforms recorded distally. IMPRESSION: No evidence of hemodynamically significant lower extremity arterial occlusive disease at rest. Electronically Signed   By: Lucrezia Europe  M.D.   On: 07/14/2016 13:59   Dg Foot Complete Right  Result Date: 07/13/2016 CLINICAL DATA:  Great toe infection EXAM: RIGHT FOOT COMPLETE - 3+ VIEW COMPARISON:  None. FINDINGS: There is subcutaneous gas about the proximal phalanx first digit and distal interphalangeal joint first digit consistent with bacterial infection. No clear osseous erosion identified. No extension of gas beyond the proximal phalanx. IMPRESSION: 1. Subcutaneous along the great toe consistent with gas-forming bacteria. Recommend surgical consultation. 2. No clear evidence of osteomyelitis. Electronically Signed   By: Suzy Bouchard M.D.   On: 07/13/2016 19:51    Anti-infectives: Anti-infectives    Start     Dose/Rate Route Frequency Ordered Stop   07/14/16 1800  vancomycin (VANCOCIN) IVPB 1000 mg/200 mL premix     1,000 mg 200 mL/hr over 60 Minutes Intravenous Every 24 hours 07/13/16 2244     07/14/16 0400  piperacillin-tazobactam (ZOSYN) IVPB 3.375 g     3.375 g 12.5 mL/hr over 240 Minutes Intravenous Every 8 hours 07/13/16 2244     07/14/16 0200  clindamycin (CLEOCIN) IVPB 600 mg  Status:  Discontinued     600 mg 100 mL/hr over 30 Minutes Intravenous Every 6 hours 07/13/16 2220 07/14/16 1449   07/13/16 2230  vancomycin (VANCOCIN) IVPB 1000 mg/200 mL premix  Status:  Discontinued     1,000 mg 200 mL/hr over 60 Minutes Intravenous  Once 07/13/16 2220 07/13/16 2235   07/13/16 1900  vancomycin (VANCOCIN) IVPB 1000 mg/200 mL premix     1,000 mg 200 mL/hr over 60 Minutes Intravenous  Once 07/13/16 1847 07/13/16 2145  07/13/16 1900  clindamycin (CLEOCIN) IVPB 600 mg     600 mg 100 mL/hr over 30 Minutes Intravenous  Once 07/13/16 1847 07/13/16 2047   07/13/16 1900  piperacillin-tazobactam (ZOSYN) IVPB 3.375 g     3.375 g 100 mL/hr over 30 Minutes Intravenous  Once 07/13/16 1847 07/13/16 2006      Assessment/Plan: Impression: Gangrene of right great toe with cellulitis of right second toe. Anemia. Diabetes  mellitus. Plan: Patient will need blood transfusions prior to surgery. We'll discuss with Dr. Roderic Palau.  LOS: 1 day    Juelz Whittenberg A 07/15/2016

## 2016-07-15 NOTE — Progress Notes (Signed)
PHARMACY - PHYSICIAN COMMUNICATION CRITICAL VALUE ALERT - BLOOD CULTURE IDENTIFICATION (BCID)  Results for orders placed or performed during the hospital encounter of 07/13/16  Blood Culture ID Panel (Reflexed) (Collected: 07/13/2016  6:49 PM)  Result Value Ref Range   Enterococcus species NOT DETECTED NOT DETECTED   Listeria monocytogenes NOT DETECTED NOT DETECTED   Staphylococcus species NOT DETECTED NOT DETECTED   Staphylococcus aureus NOT DETECTED NOT DETECTED   Streptococcus species DETECTED (A) NOT DETECTED   Streptococcus agalactiae NOT DETECTED NOT DETECTED   Streptococcus pneumoniae NOT DETECTED NOT DETECTED   Streptococcus pyogenes NOT DETECTED NOT DETECTED   Acinetobacter baumannii NOT DETECTED NOT DETECTED   Enterobacteriaceae species NOT DETECTED NOT DETECTED   Enterobacter cloacae complex NOT DETECTED NOT DETECTED   Escherichia coli NOT DETECTED NOT DETECTED   Klebsiella oxytoca NOT DETECTED NOT DETECTED   Klebsiella pneumoniae NOT DETECTED NOT DETECTED   Proteus species NOT DETECTED NOT DETECTED   Serratia marcescens NOT DETECTED NOT DETECTED   Haemophilus influenzae NOT DETECTED NOT DETECTED   Neisseria meningitidis NOT DETECTED NOT DETECTED   Pseudomonas aeruginosa NOT DETECTED NOT DETECTED   Candida albicans NOT DETECTED NOT DETECTED   Candida glabrata NOT DETECTED NOT DETECTED   Candida krusei NOT DETECTED NOT DETECTED   Candida parapsilosis NOT DETECTED NOT DETECTED   Candida tropicalis NOT DETECTED NOT DETECTED    Name of physician (or Provider) Contacted: Dr. Roderic Palau  A/P:  66 yo admitted with Dry gangrene of right great toe with surrounding cellulitis. Patient started on broad spectrum antibiotics with vancomycin and zosyn. Blood cultures now positive with strep species. Continue current regimen. Surgery plans amputation.  F/U with plan and deescalate as indicated.  Isac Sarna, BS [place], [place] Clinical Pharmacist Pager [phone] 07/15/2016  12:16 PM

## 2016-07-16 ENCOUNTER — Inpatient Hospital Stay (HOSPITAL_COMMUNITY): Payer: BC Managed Care – PPO

## 2016-07-16 LAB — GLUCOSE, CAPILLARY
GLUCOSE-CAPILLARY: 79 mg/dL (ref 65–99)
GLUCOSE-CAPILLARY: 202 mg/dL — AB (ref 65–99)
GLUCOSE-CAPILLARY: 242 mg/dL — AB (ref 65–99)
Glucose-Capillary: 165 mg/dL — ABNORMAL HIGH (ref 65–99)

## 2016-07-16 LAB — CBC
HCT: 22.8 % — ABNORMAL LOW (ref 36.0–46.0)
Hemoglobin: 7.6 g/dL — ABNORMAL LOW (ref 12.0–15.0)
MCH: 29.7 pg (ref 26.0–34.0)
MCHC: 33.3 g/dL (ref 30.0–36.0)
MCV: 89.1 fL (ref 78.0–100.0)
PLATELETS: 221 10*3/uL (ref 150–400)
RBC: 2.56 MIL/uL — ABNORMAL LOW (ref 3.87–5.11)
RDW: 12.8 % (ref 11.5–15.5)
WBC: 6.1 10*3/uL (ref 4.0–10.5)

## 2016-07-16 LAB — BASIC METABOLIC PANEL
ANION GAP: 6 (ref 5–15)
BUN: 16 mg/dL (ref 6–20)
CO2: 24 mmol/L (ref 22–32)
CREATININE: 1.35 mg/dL — AB (ref 0.44–1.00)
Calcium: 8.3 mg/dL — ABNORMAL LOW (ref 8.9–10.3)
Chloride: 108 mmol/L (ref 101–111)
GFR calc Af Amer: 47 mL/min — ABNORMAL LOW (ref >60)
GFR, EST NON AFRICAN AMERICAN: 41 mL/min — AB (ref >60)
GLUCOSE: 103 mg/dL — AB (ref 65–99)
Potassium: 4.4 mmol/L (ref 3.5–5.1)
Sodium: 138 mmol/L (ref 135–145)

## 2016-07-16 LAB — HEMOGLOBIN AND HEMATOCRIT, BLOOD
HEMATOCRIT: 31.6 % — AB (ref 36.0–46.0)
HEMOGLOBIN: 10.8 g/dL — AB (ref 12.0–15.0)

## 2016-07-16 MED ORDER — CHLORHEXIDINE GLUCONATE CLOTH 2 % EX PADS: 6.0000 | MEDICATED_PAD | Freq: Once | CUTANEOUS | Status: DC

## 2016-07-16 MED ORDER — AMLODIPINE BESYLATE 5 MG PO TABS
5.0000 mg | ORAL_TABLET | Freq: Every day | ORAL | Status: DC
  Administered 2016-07-16 – 2016-07-19 (×3): 5 mg via ORAL
  Filled 2016-07-16 (×3): qty 1

## 2016-07-16 MED ORDER — CHLORHEXIDINE GLUCONATE CLOTH 2 % EX PADS
6.0000 | MEDICATED_PAD | Freq: Once | CUTANEOUS | Status: AC
  Administered 2016-07-16: 6 via TOPICAL

## 2016-07-16 MED ORDER — HYDRALAZINE HCL 20 MG/ML IJ SOLN
10.0000 mg | Freq: Four times a day (QID) | INTRAMUSCULAR | Status: DC | PRN
  Administered 2016-07-16: 10 mg via INTRAVENOUS
  Filled 2016-07-16: qty 1

## 2016-07-16 MED ORDER — METOPROLOL TARTRATE 25 MG PO TABS
12.5000 mg | ORAL_TABLET | Freq: Two times a day (BID) | ORAL | Status: DC
  Administered 2016-07-16 (×2): 12.5 mg via ORAL
  Filled 2016-07-16 (×2): qty 1

## 2016-07-16 NOTE — Progress Notes (Signed)
Patient's manual BP 190/85.  Patient states she take Diovan at home.  Dr. Roderic Palau notified via text page.

## 2016-07-16 NOTE — Progress Notes (Signed)
Lab notified of vanc trough order

## 2016-07-16 NOTE — Progress Notes (Signed)
Patient has order to transfuse 2 units of PRBCs.  RN called Dr. Roderic Palau.  Dr. Roderic Palau stated to transfuse the 2 units of PRBCs today.

## 2016-07-16 NOTE — Progress Notes (Signed)
Wound care performed per order.  Clean sock in place.  Patient resting in recliner with foot elevated.

## 2016-07-16 NOTE — Progress Notes (Signed)
Subjective: No new complaints.  Objective: Vital signs in last 24 hours: Temp:  [97.9 F (36.6 C)-98.8 F (37.1 C)] 98.1 F (36.7 C) (02/11 0845) Pulse Rate:  [68-76] 69 (02/11 0845) Resp:  [12-16] 12 (02/11 0845) BP: (168-182)/(63-73) 182/63 (02/11 0845) SpO2:  [98 %-100 %] 100 % (02/11 0845) Last BM Date: 07/15/16  Intake/Output from previous day: 02/10 0701 - 02/11 0700 In: 2492.5 [P.O.:840; I.V.:1352.5; IV Piggyback:300] Out: 1000 [Urine:1000] Intake/Output this shift: Total I/O In: -  Out: 800 [Urine:800]  General appearance: alert, cooperative and no distress Extremities: Right great toe with dry gangrene and purulent drainage present at the base. Right second toe swollen and erythematous. Erythema noted over metatarsal region of great toe.  Lab Results:   Recent Labs  07/15/16 0544 07/16/16 0615  WBC 6.6 6.1  HGB 8.2* 7.6*  HCT 22.8* 22.8*  PLT 234 221   BMET  Recent Labs  07/15/16 0544 07/16/16 0615  NA 136 138  K 4.2 4.4  CL 105 108  CO2 24 24  GLUCOSE 126* 103*  BUN 23* 16  CREATININE 1.50* 1.35*  CALCIUM 8.3* 8.3*   PT/INR No results for input(s): LABPROT, INR in the last 72 hours.  Studies/Results: US Arterial Seg Single  Result Date: 07/14/2016 CLINICAL DATA:  Dry gangrene of right great toe post fungal nail infection, ischemic changes right second toe, neuropathy EXAM: NONINVASIVE PHYSIOLOGIC VASCULAR STUDY OF BILATERAL LOWER EXTREMITIES TECHNIQUE: Evaluation of both lower extremities were performed at rest, including calculation of ankle-brachial indices with single level Doppler, pressure and pulse volume recording. COMPARISON:  None. FINDINGS: Right ABI:  1.18 Left ABI:  1.13 Right Lower Extremity: Triphasic waveform in the distal posterior tibial artery, biphasic in dorsalis pedis. Left Lower Extremity:  Triphasic waveforms recorded distally. IMPRESSION: No evidence of hemodynamically significant lower extremity arterial occlusive  disease at rest. Electronically Signed   By: Lucrezia Europe M.D.   On: 07/14/2016 13:59    Anti-infectives: Anti-infectives    Start     Dose/Rate Route Frequency Ordered Stop   07/14/16 1800  vancomycin (VANCOCIN) IVPB 1000 mg/200 mL premix     1,000 mg 200 mL/hr over 60 Minutes Intravenous Every 24 hours 07/13/16 2244     07/14/16 0400  piperacillin-tazobactam (ZOSYN) IVPB 3.375 g     3.375 g 12.5 mL/hr over 240 Minutes Intravenous Every 8 hours 07/13/16 2244     07/14/16 0200  clindamycin (CLEOCIN) IVPB 600 mg  Status:  Discontinued     600 mg 100 mL/hr over 30 Minutes Intravenous Every 6 hours 07/13/16 2220 07/14/16 1449   07/13/16 2230  vancomycin (VANCOCIN) IVPB 1000 mg/200 mL premix  Status:  Discontinued     1,000 mg 200 mL/hr over 60 Minutes Intravenous  Once 07/13/16 2220 07/13/16 2235   07/13/16 1900  vancomycin (VANCOCIN) IVPB 1000 mg/200 mL premix     1,000 mg 200 mL/hr over 60 Minutes Intravenous  Once 07/13/16 1847 07/13/16 2145   07/13/16 1900  clindamycin (CLEOCIN) IVPB 600 mg     600 mg 100 mL/hr over 30 Minutes Intravenous  Once 07/13/16 1847 07/13/16 2047   07/13/16 1900  piperacillin-tazobactam (ZOSYN) IVPB 3.375 g     3.375 g 100 mL/hr over 30 Minutes Intravenous  Once 07/13/16 1847 07/13/16 2006      Assessment/Plan: Impression: Gangrene of right great toe and second toe. Plan: We will proceed with transmetatarsal amputation of right great toe and second toe tomorrow. Patient currently receiving blood transfusion.  I did tell the patient that I would have to leave the wound open and will apply a wound VAC. The risks and benefits of the procedure were fully explained to the patient, who gave informed consent.  LOS: 2 days    Terryl Molinelli A 07/16/2016

## 2016-07-16 NOTE — Progress Notes (Signed)
Late entry:  Patient's BP >160 SBP but <100 DBP.  Order for hydralazine is for >160/100.  Dr. Roderic Palau notified of BP.

## 2016-07-16 NOTE — Progress Notes (Signed)
PROGRESS NOTE    Sarah Bridges  XIP:[id number] DOB: 25-Jul-1950 DOA: 07/13/2016 PCP: [name], DO   Brief Narrative:  66 year old female with history of diabetes, chronic kidney disease stage III and hypertension, presents to the hospital with discoloration and foul smell coming from her right great toe for 2 weeks prior to admission. She was found to have dry gangrene of her right great toe with surrounding cellulitis. She was started on antibiotics and admitted for further treatment.   Assessment & Plan:   Active Problems:   Diabetes mellitus (HCC)   Dry gangrene (HCC)   Hypertension   Anemia   CKD (chronic kidney disease) stage 3, GFR 30-59 ml/min   Gangrene of toe of right foot ([place])   AKI (acute kidney injury) ([place])   1. Dry gangrene of right great toe with surrounding cellulitis. Patient has been started on intravenous antibiotics with vancomycin and Zosyn. Gen. surgery is following and plans on amputation. She has 1 out of 2 positive blood cultures for Streptococcus. Follow-up further identification/sensitivities. Continue current treatments  2. Uncontrolled diabetes. Continue on Lantus and NovoLog. A1c is 8.1. Blood sugars have improved since admission..  3. AKI on CKD3. Likely has some degree of dehydration with ongoing infection. Improving with hydration. Continue to follow.  4. Anemia, likely related to chronic disease from her diabetic wound. Patient has not had any obvious bleeding. 2 units of PRBCs were ordered yesterday, but they were not transfused. These will be transfused today. Follow-up hemoglobin after transfusion to see if she needs any further blood..  5. Hypertension. Patient was taking ARB/hydrochlorothiazide prior to admission. These have been discontinued due to renal failure. Blood pressure has been trending up. Start the patient on low-dose Lopressor as well as Norvasc. Currently on hydralazine when necessary .  DVT prophylaxis: lovenox Code  Status: full Family Communication: discussed with patient, no family present Disposition Plan: discharge home once improved   Consultants:   General surgery  Procedures:     Antimicrobials:   Vancomycin 2/8>>  Zosyn 2/8>>   Subjective: No pain in foot. No chest pain or shortness of breath.  Objective: Vitals:   07/16/16 1423 07/16/16 1532 07/16/16 1623 07/16/16 1645  BP: (!) 178/67 (!) 182/69 (!) 189/69 (!) 189/73  Pulse: 70 69 69 72  Resp: 15 13 18 13   Temp: 98.3 F (36.8 C) 99.7 F (37.6 C) 98.5 F (36.9 C) 98.5 F (36.9 C)  TempSrc: Oral Oral Oral Oral  SpO2: 100% 100% 100% 100%  Weight:      Height:        Intake/Output Summary (Last 24 hours) at 07/16/16 1758 Last data filed at 07/16/16 1424  Gross per 24 hour  Intake             1739 ml  Output             1750 ml  Net              -11 ml   Filed Weights   07/13/16 1754 07/13/16 2159 07/15/16 0544  Weight: 77.1 kg (170 lb) 77.8 kg (171 lb 8.3 oz) 77.7 kg (171 lb 6.2 oz)    Examination:  General exam: Appears calm and comfortable  Respiratory system: Clear to auscultation. Respiratory effort normal. Cardiovascular system: S1 & S2 heard, RRR. No JVD, murmurs, rubs, gallops or clicks. No pedal edema. Gastrointestinal system: Abdomen is nondistended, soft and nontender. No organomegaly or masses felt. Normal bowel sounds heard. Central nervous system:  Alert and oriented. No focal neurological deficits. Extremities: Symmetric 5 x 5 power. Skin: right great toe is gangrenous with surrounding erythema at base with foul smell Psychiatry: Judgement and insight appear normal. Mood & affect appropriate.     Data Reviewed: I have personally reviewed following labs and imaging studies  CBC:  Recent Labs Lab 07/13/16 1844 07/14/16 0409 07/15/16 0544 07/16/16 0615  WBC 10.5 8.5 6.6 6.1  NEUTROABS 9.2*  --   --   --   HGB 8.7* 7.6* 8.2* 7.6*  HCT 25.5* 22.0* 22.8* 22.8*  MCV 88.2 88.4 88.7 89.1    PLT 250 215 234 725   Basic Metabolic Panel:  Recent Labs Lab 07/13/16 1844 07/13/16 2210 07/14/16 0409 07/15/16 0544 07/16/16 0615  NA 132*  --  132* 136 138  K 5.0  --  4.6 4.2 4.4  CL 101  --  101 105 108  CO2 22  --  23 24 24   GLUCOSE 377* 373* 330* 126* 103*  BUN 37*  --  34* 23* 16  CREATININE 1.98*  --  1.70* 1.50* 1.35*  CALCIUM 8.6*  --  8.3* 8.3* 8.3*   GFR: Estimated Creatinine Clearance: 40.6 mL/min (by C-G formula based on SCr of 1.35 mg/dL (H)). Liver Function Tests:  Recent Labs Lab 07/13/16 1844 07/14/16 0409  AST 16 13*  ALT 12* 11*  ALKPHOS 66 57  BILITOT 0.7 0.4  PROT 7.4 6.0*  ALBUMIN 2.6* 2.3*   No results for input(s): LIPASE, AMYLASE in the last 168 hours. No results for input(s): AMMONIA in the last 168 hours. Coagulation Profile: No results for input(s): INR, PROTIME in the last 168 hours. Cardiac Enzymes: No results for input(s): CKTOTAL, CKMB, CKMBINDEX, TROPONINI in the last 168 hours. BNP (last 3 results) No results for input(s): PROBNP in the last 8760 hours. HbA1C:  Recent Labs  07/13/16 1844  HGBA1C 8.1*   CBG:  Recent Labs Lab 07/15/16 1603 07/15/16 2102 07/16/16 0756 07/16/16 1111 07/16/16 1642  GLUCAP 275* 243* 79 165* 202*   Lipid Profile: No results for input(s): CHOL, HDL, LDLCALC, TRIG, CHOLHDL, LDLDIRECT in the last 72 hours. Thyroid Function Tests: No results for input(s): TSH, T4TOTAL, FREET4, T3FREE, THYROIDAB in the last 72 hours. Anemia Panel:  Recent Labs  07/14/16 1528  VITAMINB12 115*  FOLATE 13.4  FERRITIN 768*  TIBC 165*  IRON 37  RETICCTPCT 1.8   Sepsis Labs:  Recent Labs Lab 07/13/16 1849  LATICACIDVEN 1.2    Recent Results (from the past 240 hour(s))  Blood culture (routine x 2)     Status: None (Preliminary result)   Collection Time: 07/13/16  6:49 PM  Result Value Ref Range Status   Specimen Description RIGHT ANTECUBITAL  Final   Special Requests BOTTLES DRAWN AEROBIC AND  ANAEROBIC 6CC  Final   Culture NO GROWTH < 12 HOURS  Final   Report Status PENDING  Incomplete  Blood culture (routine x 2)     Status: Abnormal (Preliminary result)   Collection Time: 07/13/16  6:49 PM  Result Value Ref Range Status   Specimen Description LEFT ANTECUBITAL  Final   Special Requests BOTTLES DRAWN AEROBIC AND ANAEROBIC 6CC  Final   Culture  Setup Time   Final    GRAM POSITIVE COCCI Gram Stain Report Called to,Read Back By and Verified With: NURSE RENEA AT 2257 ON 07/14/2016 BY EVA ANAEROBIC BOTTLE ONLY Performed at [facility] Organism ID to follow CRITICAL RESULT CALLED TO, READ  BACK BY AND VERIFIED WITH: T HANDY,RN @0533  07/15/16 MKELLY,MLT    Culture (A)  Final    STREPTOCOCCUS INTERMEDIUS THE SIGNIFICANCE OF ISOLATING THIS ORGANISM FROM A SINGLE SET OF BLOOD CULTURES WHEN MULTIPLE SETS ARE DRAWN IS UNCERTAIN. PLEASE NOTIFY THE MICROBIOLOGY DEPARTMENT WITHIN ONE WEEK IF SPECIATION AND SENSITIVITIES ARE REQUIRED. Performed at [facility], [place] [address] [place], [place] [address]    Report Status PENDING  Incomplete  Blood Culture ID Panel (Reflexed)     Status: Abnormal   Collection Time: 07/13/16  6:49 PM  Result Value Ref Range Status   Enterococcus species NOT DETECTED NOT DETECTED Final   Listeria monocytogenes NOT DETECTED NOT DETECTED Final   Staphylococcus species NOT DETECTED NOT DETECTED Final   Staphylococcus aureus NOT DETECTED NOT DETECTED Final   Streptococcus species DETECTED (A) NOT DETECTED Final    Comment: Not Enterococcus species, Streptococcus agalactiae, Streptococcus pyogenes, or Streptococcus pneumoniae. CRITICAL RESULT CALLED TO, READ BACK BY AND VERIFIED WITH: T HANDY,RN @02 /10/18 MKELLY,MLT    Streptococcus agalactiae NOT DETECTED NOT DETECTED Final   Streptococcus pneumoniae NOT DETECTED NOT DETECTED Final   Streptococcus pyogenes NOT DETECTED NOT DETECTED Final   Acinetobacter baumannii NOT DETECTED NOT DETECTED Final    Enterobacteriaceae species NOT DETECTED NOT DETECTED Final   Enterobacter cloacae complex NOT DETECTED NOT DETECTED Final   Escherichia coli NOT DETECTED NOT DETECTED Final   Klebsiella oxytoca NOT DETECTED NOT DETECTED Final   Klebsiella pneumoniae NOT DETECTED NOT DETECTED Final   Proteus species NOT DETECTED NOT DETECTED Final   Serratia marcescens NOT DETECTED NOT DETECTED Final   Haemophilus influenzae NOT DETECTED NOT DETECTED Final   Neisseria meningitidis NOT DETECTED NOT DETECTED Final   Pseudomonas aeruginosa NOT DETECTED NOT DETECTED Final   Candida albicans NOT DETECTED NOT DETECTED Final   Candida glabrata NOT DETECTED NOT DETECTED Final   Candida krusei NOT DETECTED NOT DETECTED Final   Candida parapsilosis NOT DETECTED NOT DETECTED Final   Candida tropicalis NOT DETECTED NOT DETECTED Final    Comment: Performed at [facility], [place] [address], [place], [place] [address]  Aerobic Culture (superficial specimen)     Status: None (Preliminary result)   Collection Time: 07/13/16  8:34 PM  Result Value Ref Range Status   Specimen Description FOOT RIGHT  Final   Special Requests IMMUNOCOMPROMISED  Final   Gram Stain   Final    MODERATE WBC PRESENT, PREDOMINANTLY PMN ABUNDANT GRAM POSITIVE COCCI IN PAIRS ABUNDANT GRAM NEGATIVE RODS FEW GRAM POSITIVE RODS    Culture   Final    CULTURE REINCUBATED FOR BETTER GROWTH Performed at [facility], [place] [address] [place], [place] [address]    Report Status PENDING  Incomplete         Radiology Studies: Dg Chest Port 1 View  Result Date: 07/16/2016 CLINICAL DATA:  Hypertension EXAM: PORTABLE CHEST 1 VIEW COMPARISON:  None. FINDINGS: Lungs are clear.  No pleural effusion or pneumothorax. The heart is top-normal in size. IMPRESSION: No evidence of acute cardiopulmonary disease. Electronically Signed   By: Julian Hy M.D.   On: 07/16/2016 10:33        Scheduled Meds: . sodium chloride   Intravenous Once   . amLODipine  5 mg Oral Daily  . enoxaparin (LOVENOX) injection  40 mg Subcutaneous Q24H  . ferrous sulfate  325 mg Oral BID  . insulin aspart  0-20 Units Subcutaneous TID WC  . insulin aspart  0-5 Units Subcutaneous QHS  . insulin aspart  4 Units Subcutaneous TID WC  . insulin detemir  30 Units Subcutaneous QPM  . metoprolol tartrate  12.5 mg Oral BID  . piperacillin-tazobactam (ZOSYN)  IV  3.375 g Intravenous Q8H  . vancomycin  1,000 mg Intravenous Q24H   Continuous Infusions: . sodium chloride 75 mL/hr at 07/16/16 1513     LOS: 2 days    Time spent: 1mins    MEMON,JEHANZEB, MD [facility] Pager [phone]  If 7PM-7AM, please contact night-coverage [email or web address] Password [facility] 07/16/2016, 5:58 PM

## 2016-07-17 ENCOUNTER — Inpatient Hospital Stay (HOSPITAL_COMMUNITY): Payer: BC Managed Care – PPO | Admitting: Anesthesiology

## 2016-07-17 ENCOUNTER — Encounter (HOSPITAL_COMMUNITY): Payer: Self-pay | Admitting: Anesthesiology

## 2016-07-17 ENCOUNTER — Encounter (HOSPITAL_COMMUNITY)
Admission: EM | Disposition: A | Discharge status: Home Health | Payer: Self-pay | Source: Home / Self Care | Attending: Internal Medicine

## 2016-07-17 HISTORY — PX: AMPUTATION TOE: SHX6595

## 2016-07-17 LAB — BASIC METABOLIC PANEL
Anion gap: 9 (ref 5–15)
BUN: 14 mg/dL (ref 6–20)
CALCIUM: 8.4 mg/dL — AB (ref 8.9–10.3)
CHLORIDE: 105 mmol/L (ref 101–111)
CO2: 23 mmol/L (ref 22–32)
CREATININE: 1.33 mg/dL — AB (ref 0.44–1.00)
GFR, EST AFRICAN AMERICAN: 48 mL/min — AB (ref >60)
GFR, EST NON AFRICAN AMERICAN: 41 mL/min — AB (ref >60)
Glucose, Bld: 186 mg/dL — ABNORMAL HIGH (ref 65–99)
Potassium: 3.9 mmol/L (ref 3.5–5.1)
SODIUM: 137 mmol/L (ref 135–145)

## 2016-07-17 LAB — GLUCOSE, CAPILLARY
GLUCOSE-CAPILLARY: 147 mg/dL — AB (ref 65–99)
GLUCOSE-CAPILLARY: 161 mg/dL — AB (ref 65–99)
Glucose-Capillary: 134 mg/dL — ABNORMAL HIGH (ref 65–99)
Glucose-Capillary: 291 mg/dL — ABNORMAL HIGH (ref 65–99)
Glucose-Capillary: 374 mg/dL — ABNORMAL HIGH (ref 65–99)

## 2016-07-17 LAB — TYPE AND SCREEN
ABO/RH(D): B NEG
Antibody Screen: NEGATIVE
UNIT DIVISION: 0
Unit division: 0

## 2016-07-17 LAB — CBC
HCT: 29.7 % — ABNORMAL LOW (ref 36.0–46.0)
Hemoglobin: 10.1 g/dL — ABNORMAL LOW (ref 12.0–15.0)
MCH: 30 pg (ref 26.0–34.0)
MCHC: 34 g/dL (ref 30.0–36.0)
MCV: 88.1 fL (ref 78.0–100.0)
PLATELETS: 204 10*3/uL (ref 150–400)
RBC: 3.37 MIL/uL — AB (ref 3.87–5.11)
RDW: 13 % (ref 11.5–15.5)
WBC: 7.2 10*3/uL (ref 4.0–10.5)

## 2016-07-17 LAB — CULTURE, BLOOD (ROUTINE X 2)

## 2016-07-17 LAB — SURGICAL PCR SCREEN
MRSA, PCR: NEGATIVE
STAPHYLOCOCCUS AUREUS: NEGATIVE

## 2016-07-17 LAB — VANCOMYCIN, TROUGH: VANCOMYCIN TR: 19 ug/mL (ref 15–20)

## 2016-07-17 SURGERY — AMPUTATION, TOE
Anesthesia: General | Laterality: Right

## 2016-07-17 MED ORDER — LIDOCAINE HCL (CARDIAC) 20 MG/ML IV SOLN
INTRAVENOUS | Status: DC | PRN
  Administered 2016-07-17: 30 mg via INTRAVENOUS

## 2016-07-17 MED ORDER — PROPOFOL 10 MG/ML IV BOLUS
INTRAVENOUS | Status: DC | PRN
  Administered 2016-07-17: 130 mg via INTRAVENOUS

## 2016-07-17 MED ORDER — HYDROCODONE-ACETAMINOPHEN 5-325 MG PO TABS
1.0000 | ORAL_TABLET | ORAL | Status: DC | PRN
  Administered 2016-07-18 (×3): 2 via ORAL
  Filled 2016-07-17 (×3): qty 2

## 2016-07-17 MED ORDER — KETOROLAC TROMETHAMINE 30 MG/ML IJ SOLN
30.0000 mg | Freq: Once | INTRAMUSCULAR | Status: AC
  Administered 2016-07-17: 30 mg via INTRAVENOUS
  Filled 2016-07-17: qty 1

## 2016-07-17 MED ORDER — ONDANSETRON HCL 4 MG/2ML IJ SOLN
INTRAMUSCULAR | Status: AC
  Filled 2016-07-17: qty 2

## 2016-07-17 MED ORDER — LIDOCAINE HCL (PF) 1 % IJ SOLN
INTRAMUSCULAR | Status: AC
Start: 2016-07-17 — End: 2016-07-17
  Filled 2016-07-17: qty 5

## 2016-07-17 MED ORDER — METOPROLOL TARTRATE 25 MG PO TABS
25.0000 mg | ORAL_TABLET | Freq: Two times a day (BID) | ORAL | Status: DC
  Administered 2016-07-17 – 2016-07-19 (×4): 25 mg via ORAL
  Filled 2016-07-17 (×4): qty 1

## 2016-07-17 MED ORDER — ARTIFICIAL TEARS OP OINT
TOPICAL_OINTMENT | OPHTHALMIC | Status: AC
  Filled 2016-07-17: qty 3.5

## 2016-07-17 MED ORDER — PROPOFOL 10 MG/ML IV BOLUS
INTRAVENOUS | Status: AC
  Filled 2016-07-17: qty 20

## 2016-07-17 MED ORDER — LABETALOL HCL 5 MG/ML IV SOLN
10.0000 mg | INTRAVENOUS | Status: AC | PRN
  Administered 2016-07-17 (×2): 10 mg via INTRAVENOUS

## 2016-07-17 MED ORDER — MIDAZOLAM HCL 5 MG/5ML IJ SOLN
INTRAMUSCULAR | Status: DC | PRN
  Administered 2016-07-17: 2 mg via INTRAVENOUS

## 2016-07-17 MED ORDER — MIDAZOLAM HCL 2 MG/2ML IJ SOLN
INTRAMUSCULAR | Status: AC
  Filled 2016-07-17: qty 2

## 2016-07-17 MED ORDER — MIDAZOLAM HCL 2 MG/2ML IJ SOLN
1.0000 mg | INTRAMUSCULAR | Status: AC
  Administered 2016-07-17 (×2): 2 mg via INTRAVENOUS

## 2016-07-17 MED ORDER — FENTANYL CITRATE (PF) 100 MCG/2ML IJ SOLN
INTRAMUSCULAR | Status: AC
  Filled 2016-07-17: qty 2

## 2016-07-17 MED ORDER — HYDRALAZINE HCL 25 MG PO TABS
25.0000 mg | ORAL_TABLET | Freq: Three times a day (TID) | ORAL | Status: DC
  Administered 2016-07-17 – 2016-07-19 (×6): 25 mg via ORAL
  Filled 2016-07-17 (×6): qty 1

## 2016-07-17 MED ORDER — ONDANSETRON HCL 4 MG/2ML IJ SOLN
4.0000 mg | Freq: Once | INTRAMUSCULAR | Status: AC
  Administered 2016-07-17: 4 mg via INTRAVENOUS

## 2016-07-17 MED ORDER — LACTATED RINGERS IV SOLN
INTRAVENOUS | Status: DC
  Administered 2016-07-17 – 2016-07-18 (×3): via INTRAVENOUS

## 2016-07-17 MED ORDER — FENTANYL CITRATE (PF) 100 MCG/2ML IJ SOLN
INTRAMUSCULAR | Status: DC | PRN
  Administered 2016-07-17: 50 ug via INTRAVENOUS
  Administered 2016-07-17 (×2): 25 ug via INTRAVENOUS

## 2016-07-17 MED ORDER — SODIUM CHLORIDE 0.9 % IR SOLN
Status: DC | PRN
  Administered 2016-07-17: 1000 mL

## 2016-07-17 MED ORDER — LABETALOL HCL 5 MG/ML IV SOLN
INTRAVENOUS | Status: AC
  Filled 2016-07-17: qty 4

## 2016-07-17 MED ORDER — FENTANYL CITRATE (PF) 100 MCG/2ML IJ SOLN: 25.0000 ug | INTRAMUSCULAR | Status: DC | PRN

## 2016-07-17 SURGICAL SUPPLY — 31 items
BAG HAMPER (MISCELLANEOUS) ×2 IMPLANT
BANDAGE ELASTIC 4 VELCRO NS (GAUZE/BANDAGES/DRESSINGS) IMPLANT
BLADE GIGLI SAW 510M (BLADE) IMPLANT
BLADE OSC/SAGITTAL MD 9X18.5 (BLADE) ×1 IMPLANT
BNDG GAUZE ELAST 4 BULKY (GAUZE/BANDAGES/DRESSINGS) IMPLANT
CANISTER WOUND CARE 500ML ATS (WOUND CARE) ×1 IMPLANT
CLOTH BEACON ORANGE TIMEOUT ST (SAFETY) ×2 IMPLANT
COVER LIGHT HANDLE STERIS (MISCELLANEOUS) ×4 IMPLANT
DRSG VAC ATS SM SENSATRAC (GAUZE/BANDAGES/DRESSINGS) ×2 IMPLANT
DRSG XEROFORM 1X8 (GAUZE/BANDAGES/DRESSINGS) IMPLANT
ELECT REM PT RETURN 9FT ADLT (ELECTROSURGICAL) ×2
ELECTRODE REM PT RTRN 9FT ADLT (ELECTROSURGICAL) ×1 IMPLANT
FORMALIN 10 PREFIL 120ML (MISCELLANEOUS) ×2 IMPLANT
GAUZE SPONGE 4X4 12PLY STRL (GAUZE/BANDAGES/DRESSINGS) ×2 IMPLANT
GLOVE BIOGEL PI IND STRL 7.0 (GLOVE) ×1 IMPLANT
GLOVE BIOGEL PI INDICATOR 7.0 (GLOVE) ×3
GLOVE SURG SS PI 7.5 STRL IVOR (GLOVE) ×3 IMPLANT
GOWN STRL REUS W/TWL LRG LVL3 (GOWN DISPOSABLE) ×6 IMPLANT
INST SET MINOR BONE (KITS) ×2 IMPLANT
KIT ROOM TURNOVER APOR (KITS) ×2 IMPLANT
MANIFOLD NEPTUNE II (INSTRUMENTS) ×2 IMPLANT
NS IRRIG 1000ML POUR BTL (IV SOLUTION) ×2 IMPLANT
PACK BASIC LIMB (CUSTOM PROCEDURE TRAY) ×2 IMPLANT
PAD ARMBOARD 7.5X6 YLW CONV (MISCELLANEOUS) ×2 IMPLANT
SET BASIN LINEN APH (SET/KITS/TRAYS/PACK) ×2 IMPLANT
SOL PREP PROV IODINE SCRUB 4OZ (MISCELLANEOUS) ×2 IMPLANT
SPONGE LAP 18X18 X RAY DECT (DISPOSABLE) ×2 IMPLANT
SUT ETHILON 3 0 FSL (SUTURE) IMPLANT
SUT PROLENE 2 0 FS (SUTURE) IMPLANT
SUT VIC AB 3-0 SH 27 (SUTURE) ×2
SUT VIC AB 3-0 SH 27XBRD (SUTURE) ×1 IMPLANT

## 2016-07-17 NOTE — Op Note (Signed)
Patient:  Sarah Bridges  DOB:  [birth year]  MRN:  [id number]   Preop Diagnosis:  Gangrene, right great toe and right second toe  Postop Diagnosis:  Same  Procedure:  Transmetatarsal amputation of right great and second toes  Surgeon:  Aviva Signs, M.D.  Anes:  Gen.  Indications:  Patient is a 66 year old uncontrolled diabetic who presented with gangrene of the right great and second toes. The risks and benefits of the procedure including bleeding, infection, and the possibility of needing further surgery were fully explained to the patient, who gave informed consent.  Procedure note:  The patient was placed in the supine position. After general anesthesia was monitored, the right foot was prepped and draped using usual sterile technique with Betadine. Surgical site confirmation was performed.  The patient had dry gangrene of the right great toe with purulent drainage noted from the base. The right second toe was also noted to be with skin necrosis and erythema. An elliptical incision was made to encompass both the first and second toes. The dissection was taken down to just proximal to the metatarsal heads. A transmetatarsal amputation was then performed using bone cutters. The toes were removed en bloc and sent to pathology for examination. The wound was irrigated normal saline. The patient also had a 1.5 cm opening along the sole of foot proximal to the first metatarsal head. This was debrided. The wound was irrigated with normal saline. A wound VAC was then applied.  All tape needle counts were correct at the end the procedure. The patient was awakened and transferred to PACU in stable condition.  Complications:  None  EBL:  Minimal  Specimen:  Right great and second toes

## 2016-07-17 NOTE — Anesthesia Preprocedure Evaluation (Addendum)
Anesthesia Evaluation  Patient identified by MRN, date of birth, ID band Patient awake    Reviewed: Allergy & Precautions, NPO status , Patient's Chart, lab work & pertinent test results  Airway Mallampati: II  TM Distance: >3 FB Neck ROM: full    Dental  (+) Teeth Intact   Pulmonary neg pulmonary ROS,    breath sounds clear to auscultation       Cardiovascular hypertension, Pt. on medications and Pt. on home beta blockers  Rhythm:Regular Rate:Normal     Neuro/Psych  Neuromuscular disease ( Peripheral neuropathy )    GI/Hepatic negative GI ROS,   Endo/Other  diabetes, Type 2, Insulin DependentMorbid obesityobese  Renal/GU Renal disease     Musculoskeletal   Abdominal   Peds  Hematology   Anesthesia Other Findings   Reproductive/Obstetrics                             Anesthesia Physical Anesthesia Plan  ASA: III  Anesthesia Plan: General   Post-op Pain Management:    Induction: Intravenous  Airway Management Planned: LMA  Additional Equipment:   Intra-op Plan:   Post-operative Plan: Extubation in OR  Informed Consent: I have reviewed the patients History and Physical, chart, labs and discussed the procedure including the risks, benefits and alternatives for the proposed anesthesia with the patient or authorized representative who has indicated his/her understanding and acceptance.     Plan Discussed with:   Anesthesia Plan Comments: (Pt requests Gen anesthesia.)       Anesthesia Quick Evaluation

## 2016-07-17 NOTE — Transfer of Care (Signed)
Immediate Anesthesia Transfer of Care Note  Patient: Sarah Bridges  Procedure(s) Performed: Procedure(s) with comments: AMPUTATION TOE right great and second toe (Right) - great and second toe, right  Patient Location: PACU  Anesthesia Type:General  Level of Consciousness: awake, oriented and patient cooperative  Airway & Oxygen Therapy: Patient Spontanous Breathing and Patient connected to face mask oxygen  Post-op Assessment: Report given to RN and Post -op Vital signs reviewed and stable  Post vital signs: Reviewed and stable  Last Vitals:  Vitals:   07/17/16 1005 07/17/16 1010  BP: (!) 171/81 (!) 176/83  Pulse:    Resp: 20 (!) 21  Temp:      Last Pain:  Vitals:   07/17/16 0820  TempSrc: Oral  PainSc: 0-No pain      Patients Stated Pain Goal: 2 (31/12/16 2446)  Complications: No apparent anesthesia complications

## 2016-07-17 NOTE — Anesthesia Postprocedure Evaluation (Addendum)
Anesthesia Post Note  Patient: Sarah Bridges  Procedure(s) Performed: Procedure(s) (LRB): AMPUTATION TOE right great and second toe (Right)  Patient location during evaluation: PACU Anesthesia Type: General Level of consciousness: awake Pain management: pain level controlled Vital Signs Assessment: post-procedure vital signs reviewed and stable Respiratory status: spontaneous breathing Cardiovascular status: stable Anesthetic complications: no Comments: Receiving  Labetalol  in PACU          Last Vitals:  Vitals:   07/17/16 1118 07/17/16 1130  BP: (!) 162/79 (!) 183/84  Pulse: 67 65  Resp: 13 (!) 9  Temp: 36.6 C     Last Pain:  Vitals:   07/17/16 1118  TempSrc:   PainSc: 0-No pain                 Peony Barner

## 2016-07-17 NOTE — Progress Notes (Signed)
Pharmacy Antibiotic Note  Sarah Bridges is a 66 y.o. female admitted on 07/13/2016 with cellulitis, Diabetic wet gangrene of foot.  Pharmacy has been consulted for Vancomycin and Zosyn  Dosing. Trough @ goal.  Plan: Continue Vancomycin 1gm IV every 24 hours.  Goal trough 15-20 mcg/mL.  Continue Zosyn 3.375gm IV every 8 hours. Follow-up micro data, labs, vitals.   Height: 5\' 2"  (157.5 cm) Weight: 179 lb 3.7 oz (81.3 kg) IBW/kg (Calculated) : 50.1  Temp (24hrs), Avg:98.4 F (36.9 C), Min:97 F (36.1 C), Max:99.8 F (37.7 C)   Recent Labs Lab 07/13/16 1844 07/13/16 1849 07/14/16 0409 07/15/16 0544 07/16/16 0615 07/17/16 0457 07/17/16 1941  WBC 10.5  --  8.5 6.6 6.1 7.2  --   CREATININE 1.98*  --  1.70* 1.50* 1.35* 1.33*  --   LATICACIDVEN  --  1.2  --   --   --   --   --   VANCOTROUGH  --   --   --   --   --   --  19    Estimated Creatinine Clearance: 42.2 mL/min (by C-G formula based on SCr of 1.33 mg/dL (H)).    No Known Allergies  Antimicrobials this admission: Vanc 2/8 >>  Zosyn 2/8 >>  Clinda 2/8 >> 2/9  Dose adjustments this admission: n/a   Microbiology results: 2/8 BCx: strep species. BCID Blood Culture ID Panel (Reflexed)  Order: [id number]  Status:  Final result   Visible to patient:  No (Not Released) Next appt:  08/30/2016 at 02:30 PM in Ophthalmology (MATTHEWS, Chrystie Nose, MD)   Ref Range & Units 2d ago  Enterococcus species NOT DETECTED NOT DETECTED   Listeria monocytogenes NOT DETECTED NOT DETECTED   Staphylococcus species NOT DETECTED NOT DETECTED   Staphylococcus aureus NOT DETECTED NOT DETECTED   Streptococcus species NOT DETECTED DETECTED    Comments: Not Enterococcus species, Streptococcus agalactiae, Streptococcus pyogenes, or Streptococcus pneumoniae.  CRITICAL RESULT CALLED TO, READ BACK BY AND VERIFIED WITH:  T HANDY,RN @02 /10/18 MKELLY,MLT   Streptococcus agalactiae NOT DETECTED NOT DETECTED   Streptococcus pneumoniae NOT DETECTED NOT  DETECTED   Streptococcus pyogenes NOT DETECTED NOT DETECTED   Acinetobacter baumannii NOT DETECTED NOT DETECTED   Enterobacteriaceae species NOT DETECTED NOT DETECTED   Enterobacter cloacae complex NOT DETECTED NOT DETECTED   Escherichia coli NOT DETECTED NOT DETECTED   Klebsiella oxytoca NOT DETECTED NOT DETECTED   Klebsiella pneumoniae NOT DETECTED NOT DETECTED   Proteus species NOT DETECTED NOT DETECTED   Serratia marcescens NOT DETECTED NOT DETECTED   Haemophilus influenzae NOT DETECTED NOT DETECTED   Neisseria meningitidis NOT DETECTED NOT DETECTED   Pseudomonas aeruginosa NOT DETECTED NOT DETECTED   Candida albicans NOT DETECTED NOT DETECTED   Candida glabrata NOT DETECTED NOT DETECTED   Candida krusei NOT DETECTED NOT DETECTED   Candida parapsilosis NOT DETECTED NOT DETECTED   Candida tropicalis NOT DETECTED NOT DETECTED   Comments: Performed at [facility] [address] [place],  [address]  Resulting Agency  [id number]    Specimen Collected: 07/13/16 18:49 Last Resulted: 07/15/16 05:31                         Encounter   View Encounter      Result Information   Flag: Abnormal Status: Final result (Collected: 07/13/2016 18:49) Provider Status: Ordered       Lab Information   [id number]  Order-Level Documents:   There are no order-level documents.  View SmartLink Info   Blood Culture ID Panel (Reflexed) (Order #[id number]) on 07/13/16  Result Read / Acknowledged   Acknowledge result  No acknowledgement history exists for this order.       2/8 Wound: GPC in pairs, GNR, few GRAM POSITIVE RODS Thank you for allowing pharmacy to be a part of this patient's care.  Pricilla Larsson 07/17/2016 8:33 PM

## 2016-07-17 NOTE — Anesthesia Procedure Notes (Signed)
Procedure Name: LMA Insertion Date/Time: 07/17/2016 10:27 AM Performed by: Andree Elk, AMY A Pre-anesthesia Checklist: Patient identified, Timeout performed, Emergency Drugs available, Suction available and Patient being monitored Patient Re-evaluated:Patient Re-evaluated prior to inductionOxygen Delivery Method: Circle system utilized Preoxygenation: Pre-oxygenation with 100% oxygen Intubation Type: IV induction Ventilation: Mask ventilation without difficulty LMA: LMA inserted LMA Size: 4.0 Number of attempts: 1 Placement Confirmation: positive ETCO2 and breath sounds checked- equal and bilateral Tube secured with: Tape Dental Injury: Teeth and Oropharynx as per pre-operative assessment

## 2016-07-17 NOTE — Progress Notes (Signed)
PROGRESS NOTE    DESTYNEE Bridges  ZJI:[id number] DOB: June 15, 1950 DOA: 07/13/2016 PCP: [name], DO   Brief Narrative:  66 year old female with history of diabetes, chronic kidney disease stage III and hypertension, presents to the hospital with discoloration and foul smell coming from her right great toe for 2 weeks prior to admission. She was found to have dry gangrene of her right great toe with surrounding cellulitis. She was started on antibiotics and admitted for further treatment.   Assessment & Plan:   Active Problems:   Diabetes mellitus (HCC)   Dry gangrene (HCC)   Hypertension   Anemia   CKD (chronic kidney disease) stage 3, GFR 30-59 ml/min   Gangrene of toe of right foot ([place])   AKI (acute kidney injury) ([place])   1. Dry gangrene of right great toe with surrounding cellulitis. Patient is on intravenous antibiotics with vancomycin and Zosyn. Gen. surgery is following and patient underwent amputation today. She has 1 out of 2 positive blood cultures for Streptococcus. Follow-up further identification/sensitivities. Continue current treatments  2. Uncontrolled diabetes. Continue on Lantus and NovoLog. A1c is 8.1. Blood sugars have improved since admission..  3. AKI on CKD3. Likely has some degree of dehydration with ongoing infection. Improving with hydration. Continue to follow.  4. Anemia, likely related to chronic disease from her diabetic wound. Patient has not had any obvious bleeding. 2 units of PRBCs were ordered yesterday, but they were not transfused. These will be transfused today. Follow-up hemoglobin after transfusion to see if she needs any further blood..  5. Hypertension. Patient was taking ARB/hydrochlorothiazide prior to admission. These have been discontinued due to renal failure. Blood pressure has been trending up. She's been started on low-dose Lopressor and Norvasc. Will add hydralazine. Continue on hydralazine when necessary .  DVT prophylaxis:  lovenox Code Status: full Family Communication: discussed with patient, no family present Disposition Plan: discharge home once improved   Consultants:   General surgery  Procedures:     Antimicrobials:   Vancomycin 2/8>>  Zosyn 2/8>>   Subjective: No pain. No shortness of breath.   Objective: Vitals:   07/17/16 1225 07/17/16 1254 07/17/16 1354 07/17/16 1751  BP: (!) 172/72 (!) 188/76 (!) 161/67 (!) 184/72  Pulse: 69 68 71 71  Resp: 13 14 17 17   Temp:  97 F (36.1 C) 99.1 F (37.3 C) 98.3 F (36.8 C)  TempSrc:  Oral Oral Axillary  SpO2: 95% 99% 99% 99%  Weight:      Height:        Intake/Output Summary (Last 24 hours) at 07/17/16 1812 Last data filed at 07/17/16 1712  Gross per 24 hour  Intake             1150 ml  Output             1200 ml  Net              -50 ml   Filed Weights   07/13/16 2159 07/15/16 0544 07/17/16 0641  Weight: 77.8 kg (171 lb 8.3 oz) 77.7 kg (171 lb 6.2 oz) 81.3 kg (179 lb 3.7 oz)    Examination:  General exam: Appears calm and comfortable  Respiratory system: Clear to auscultation. Respiratory effort normal. Cardiovascular system: S1 & S2 heard, RRR. No JVD, murmurs, rubs, gallops or clicks. No pedal edema. Gastrointestinal system: Abdomen is nondistended, soft and nontender. No organomegaly or masses felt. Normal bowel sounds heard. Central nervous system: Alert and oriented. No focal  neurological deficits. Extremities: Symmetric 5 x 5 power. Skin: right foot with dressings in place Psychiatry: Judgement and insight appear normal. Mood & affect appropriate.     Data Reviewed: I have personally reviewed following labs and imaging studies  CBC:  Recent Labs Lab 07/13/16 1844 07/14/16 0409 07/15/16 0544 07/16/16 0615 07/16/16 2130 07/17/16 0457  WBC 10.5 8.5 6.6 6.1  --  7.2  NEUTROABS 9.2*  --   --   --   --   --   HGB 8.7* 7.6* 8.2* 7.6* 10.8* 10.1*  HCT 25.5* 22.0* 22.8* 22.8* 31.6* 29.7*  MCV 88.2 88.4 88.7  89.1  --  88.1  PLT 250 215 234 221  --  008   Basic Metabolic Panel:  Recent Labs Lab 07/13/16 1844 07/13/16 2210 07/14/16 0409 07/15/16 0544 07/16/16 0615 07/17/16 0457  NA 132*  --  132* 136 138 137  K 5.0  --  4.6 4.2 4.4 3.9  CL 101  --  101 105 108 105  CO2 22  --  23 24 24 23   GLUCOSE 377* 373* 330* 126* 103* 186*  BUN 37*  --  34* 23* 16 14  CREATININE 1.98*  --  1.70* 1.50* 1.35* 1.33*  CALCIUM 8.6*  --  8.3* 8.3* 8.3* 8.4*   GFR: Estimated Creatinine Clearance: 42.2 mL/min (by C-G formula based on SCr of 1.33 mg/dL (H)). Liver Function Tests:  Recent Labs Lab 07/13/16 1844 07/14/16 0409  AST 16 13*  ALT 12* 11*  ALKPHOS 66 57  BILITOT 0.7 0.4  PROT 7.4 6.0*  ALBUMIN 2.6* 2.3*   No results for input(s): LIPASE, AMYLASE in the last 168 hours. No results for input(s): AMMONIA in the last 168 hours. Coagulation Profile: No results for input(s): INR, PROTIME in the last 168 hours. Cardiac Enzymes: No results for input(s): CKTOTAL, CKMB, CKMBINDEX, TROPONINI in the last 168 hours. BNP (last 3 results) No results for input(s): PROBNP in the last 8760 hours. HbA1C: No results for input(s): HGBA1C in the last 72 hours. CBG:  Recent Labs Lab 07/16/16 2114 07/17/16 0738 07/17/16 0848 07/17/16 1122 07/17/16 1634  GLUCAP 242* 147* 161* 134* 291*   Lipid Profile: No results for input(s): CHOL, HDL, LDLCALC, TRIG, CHOLHDL, LDLDIRECT in the last 72 hours. Thyroid Function Tests: No results for input(s): TSH, T4TOTAL, FREET4, T3FREE, THYROIDAB in the last 72 hours. Anemia Panel: No results for input(s): VITAMINB12, FOLATE, FERRITIN, TIBC, IRON, RETICCTPCT in the last 72 hours. Sepsis Labs:  Recent Labs Lab 07/13/16 1849  LATICACIDVEN 1.2    Recent Results (from the past 240 hour(s))  Blood culture (routine x 2)     Status: None (Preliminary result)   Collection Time: 07/13/16  6:49 PM  Result Value Ref Range Status   Specimen Description RIGHT  ANTECUBITAL  Final   Special Requests BOTTLES DRAWN AEROBIC AND ANAEROBIC 6CC  Final   Culture NO GROWTH < 12 HOURS  Final   Report Status PENDING  Incomplete  Blood culture (routine x 2)     Status: Abnormal   Collection Time: 07/13/16  6:49 PM  Result Value Ref Range Status   Specimen Description LEFT ANTECUBITAL  Final   Special Requests BOTTLES DRAWN AEROBIC AND ANAEROBIC 6CC  Final   Culture  Setup Time   Final    GRAM POSITIVE COCCI Gram Stain Report Called to,Read Back By and Verified With: NURSE RENEA AT 2257 ON 07/14/2016 BY EVA ANAEROBIC BOTTLE ONLY Performed at [facility]  Organism ID to follow CRITICAL RESULT CALLED TO, READ BACK BY AND VERIFIED WITH: T HANDY,RN @0533  07/15/16 MKELLY,MLT    Culture (A)  Final    STREPTOCOCCUS INTERMEDIUS THE SIGNIFICANCE OF ISOLATING THIS ORGANISM FROM A SINGLE SET OF BLOOD CULTURES WHEN MULTIPLE SETS ARE DRAWN IS UNCERTAIN. PLEASE NOTIFY THE MICROBIOLOGY DEPARTMENT WITHIN ONE WEEK IF SPECIATION AND SENSITIVITIES ARE REQUIRED. Performed at [facility], [place] [address], [place], [place] [address]    Report Status 07/17/2016 FINAL  Final  Blood Culture ID Panel (Reflexed)     Status: Abnormal   Collection Time: 07/13/16  6:49 PM  Result Value Ref Range Status   Enterococcus species NOT DETECTED NOT DETECTED Final   Listeria monocytogenes NOT DETECTED NOT DETECTED Final   Staphylococcus species NOT DETECTED NOT DETECTED Final   Staphylococcus aureus NOT DETECTED NOT DETECTED Final   Streptococcus species DETECTED (A) NOT DETECTED Final    Comment: Not Enterococcus species, Streptococcus agalactiae, Streptococcus pyogenes, or Streptococcus pneumoniae. CRITICAL RESULT CALLED TO, READ BACK BY AND VERIFIED WITH: T HANDY,RN @02 /10/18 MKELLY,MLT    Streptococcus agalactiae NOT DETECTED NOT DETECTED Final   Streptococcus pneumoniae NOT DETECTED NOT DETECTED Final   Streptococcus pyogenes NOT DETECTED NOT DETECTED Final    Acinetobacter baumannii NOT DETECTED NOT DETECTED Final   Enterobacteriaceae species NOT DETECTED NOT DETECTED Final   Enterobacter cloacae complex NOT DETECTED NOT DETECTED Final   Escherichia coli NOT DETECTED NOT DETECTED Final   Klebsiella oxytoca NOT DETECTED NOT DETECTED Final   Klebsiella pneumoniae NOT DETECTED NOT DETECTED Final   Proteus species NOT DETECTED NOT DETECTED Final   Serratia marcescens NOT DETECTED NOT DETECTED Final   Haemophilus influenzae NOT DETECTED NOT DETECTED Final   Neisseria meningitidis NOT DETECTED NOT DETECTED Final   Pseudomonas aeruginosa NOT DETECTED NOT DETECTED Final   Candida albicans NOT DETECTED NOT DETECTED Final   Candida glabrata NOT DETECTED NOT DETECTED Final   Candida krusei NOT DETECTED NOT DETECTED Final   Candida parapsilosis NOT DETECTED NOT DETECTED Final   Candida tropicalis NOT DETECTED NOT DETECTED Final    Comment: Performed at [facility], [place] [address], [place], [place] [address]  Aerobic Culture (superficial specimen)     Status: None (Preliminary result)   Collection Time: 07/13/16  8:34 PM  Result Value Ref Range Status   Specimen Description FOOT RIGHT  Final   Special Requests IMMUNOCOMPROMISED  Final   Gram Stain   Final    MODERATE WBC PRESENT, PREDOMINANTLY PMN ABUNDANT GRAM POSITIVE COCCI IN PAIRS ABUNDANT GRAM NEGATIVE RODS FEW GRAM POSITIVE RODS    Culture   Final    CULTURE REINCUBATED FOR BETTER GROWTH Performed at [facility], [place] [address] [place], [place] [address]    Report Status PENDING  Incomplete  Surgical pcr screen     Status: None   Collection Time: 07/17/16  3:58 AM  Result Value Ref Range Status   MRSA, PCR NEGATIVE NEGATIVE Final   Staphylococcus aureus NEGATIVE NEGATIVE Final    Comment:        The Xpert SA Assay (FDA approved for NASAL specimens in patients over 57 years of age), is one component of a comprehensive surveillance program.  Test performance has been  validated by [facility] for patients greater than or equal to 15 year old. It is not intended to diagnose infection nor to guide or monitor treatment.          Radiology  Studies: Dg Chest Port 1 View  Result Date: 07/16/2016 CLINICAL DATA:  Hypertension EXAM: PORTABLE CHEST 1 VIEW COMPARISON:  None. FINDINGS: Lungs are clear.  No pleural effusion or pneumothorax. The heart is top-normal in size. IMPRESSION: No evidence of acute cardiopulmonary disease. Electronically Signed   By: Julian Hy M.D.   On: 07/16/2016 10:33        Scheduled Meds: . amLODipine  5 mg Oral Daily  . enoxaparin (LOVENOX) injection  40 mg Subcutaneous Q24H  . ferrous sulfate  325 mg Oral BID  . hydrALAZINE  25 mg Oral Q8H  . insulin aspart  0-20 Units Subcutaneous TID WC  . insulin aspart  0-5 Units Subcutaneous QHS  . insulin aspart  4 Units Subcutaneous TID WC  . insulin detemir  30 Units Subcutaneous QPM  . metoprolol tartrate  25 mg Oral BID  . piperacillin-tazobactam (ZOSYN)  IV  3.375 g Intravenous Q8H  . vancomycin  1,000 mg Intravenous Q24H   Continuous Infusions: . lactated ringers 75 mL/hr at 07/17/16 1320     LOS: 3 days    Time spent: 70mins    Jessalynn Mccowan, MD [facility] Pager [phone]  If 7PM-7AM, please contact night-coverage [email or web address] Password [facility] 07/17/2016, 6:12 PM

## 2016-07-18 ENCOUNTER — Encounter (HOSPITAL_COMMUNITY): Payer: Self-pay | Admitting: General Surgery

## 2016-07-18 LAB — BASIC METABOLIC PANEL
Anion gap: 7 (ref 5–15)
BUN: 14 mg/dL (ref 6–20)
CO2: 26 mmol/L (ref 22–32)
Calcium: 8.4 mg/dL — ABNORMAL LOW (ref 8.9–10.3)
Chloride: 105 mmol/L (ref 101–111)
Creatinine, Ser: 1.39 mg/dL — ABNORMAL HIGH (ref 0.44–1.00)
GFR calc Af Amer: 45 mL/min — ABNORMAL LOW (ref >60)
GFR, EST NON AFRICAN AMERICAN: 39 mL/min — AB (ref >60)
GLUCOSE: 139 mg/dL — AB (ref 65–99)
Potassium: 4.3 mmol/L (ref 3.5–5.1)
Sodium: 138 mmol/L (ref 135–145)

## 2016-07-18 LAB — GLUCOSE, CAPILLARY
Glucose-Capillary: 109 mg/dL — ABNORMAL HIGH (ref 65–99)
Glucose-Capillary: 171 mg/dL — ABNORMAL HIGH (ref 65–99)
Glucose-Capillary: 142 mg/dL — ABNORMAL HIGH (ref 65–99)
Glucose-Capillary: 159 mg/dL — ABNORMAL HIGH (ref 65–99)

## 2016-07-18 LAB — AEROBIC CULTURE W GRAM STAIN (SUPERFICIAL SPECIMEN)

## 2016-07-18 LAB — CBC
HEMATOCRIT: 28.8 % — AB (ref 36.0–46.0)
HEMOGLOBIN: 9.7 g/dL — AB (ref 12.0–15.0)
MCH: 30 pg (ref 26.0–34.0)
MCHC: 33.7 g/dL (ref 30.0–36.0)
MCV: 89.2 fL (ref 78.0–100.0)
Platelets: 180 10*3/uL (ref 150–400)
RBC: 3.23 MIL/uL — ABNORMAL LOW (ref 3.87–5.11)
RDW: 13.1 % (ref 11.5–15.5)
WBC: 8.8 10*3/uL (ref 4.0–10.5)

## 2016-07-18 LAB — CULTURE, BLOOD (ROUTINE X 2): CULTURE: NO GROWTH

## 2016-07-18 LAB — AEROBIC CULTURE  (SUPERFICIAL SPECIMEN)

## 2016-07-18 MED ORDER — DEXTROSE 5 % IV SOLN
2.0000 g | INTRAVENOUS | Status: DC
  Administered 2016-07-18 – 2016-07-19 (×2): 2 g via INTRAVENOUS
  Filled 2016-07-18 (×2): qty 2

## 2016-07-18 NOTE — Evaluation (Signed)
Physical Therapy Evaluation Patient Details Name: Sarah Bridges MRN: [id number] DOB: Oct 14, 1950 Today's Date: 07/18/2016   History of Present Illness  66 y.o. female, With history of diabetes mellitus, diabetic neuropathy, hypertension, hyperlipidemia who came to hospital with discolored right big toe for past 2 weeks. Patient says that 2 weeks ago she noticed that the nail from beat to came off while taking her socks. She did not want to go to PCP office and took care of it at home. A week ago patient noticed that the toe was becoming black in color, still she did not want to go to doctor's office.  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  Clinical Impression  Pt received in bed, husband present, and pt was agreeable to PT evaluation.  Pt is normally independent with all ambulation, dressing and bathing.  During PT evaluation she ambulated 59ft while maintaining weight bearing through R heel only and using RW.  Pt does not demonstrate need for continued skilled PT at this time, and therefore PT will sign off.  No follow up PT at this time.     Follow Up Recommendations No PT follow up    Equipment Recommendations  Rolling walker with 5" wheels (Pt confirmed that she will borrow one from her dtr. )    Recommendations for Other Services       Precautions / Restrictions Precautions Precautions: None Restrictions Weight Bearing Restrictions: Yes RLE Weight Bearing:  (Through heel only with post op sandal. )      Mobility  Bed Mobility Overal bed mobility: Modified Independent                Transfers Overall transfer level: Modified independent                  Ambulation/Gait Ambulation/Gait assistance: Modified independent (Device/Increase time) Ambulation Distance (Feet): 60 Feet Assistive device: Rolling walker (2 wheeled) Gait Pattern/deviations: WFL(Within Functional  Limits)     General Gait Details: pt demonstrated good understanding of weight bearing through R heel only  Stairs            Wheelchair Mobility    Modified Rankin (Stroke Patients Only)       Balance Overall balance assessment: Modified Independent                                           Pertinent Vitals/Pain Pain Assessment: No/denies pain    Home Living   Living Arrangements: Spouse/significant other Available Help at Discharge: Family;Available PRN/intermittently Type of Home: House Home Access: Stairs to enter   Entrance Stairs-Number of Steps: 7 steps with HR.   Home Layout: Able to live on main level with bedroom/bathroom;Two level Home Equipment: None Additional Comments: Dtr has a walker that the pt can borrow.      Prior Function Level of Independence: Independent   Gait / Transfers Assistance Needed: unlimited community ambulation  ADL's / Homemaking Assistance Needed: independent with dressing and bathing.  Still driving.         Hand Dominance   Dominant Hand: Right    Extremity/Trunk Assessment   Upper Extremity Assessment Upper Extremity Assessment: Overall WFL for tasks assessed    Lower Extremity Assessment Lower Extremity Assessment: Overall WFL for tasks assessed       Communication  Communication: No difficulties  Cognition Arousal/Alertness: Awake/alert Behavior During Therapy: WFL for tasks assessed/performed Overall Cognitive Status: Within Functional Limits for tasks assessed                      General Comments      Exercises     Assessment/Plan    PT Assessment Patent does not need any further PT services  PT Problem List            PT Treatment Interventions      PT Goals (Current goals can be found in the Care Plan section)  Acute Rehab PT Goals PT Goal Formulation: All assessment and education complete, DC therapy    Frequency     Barriers to discharge         Co-evaluation               End of Session Equipment Utilized During Treatment: Gait belt;Other (comment) (wound vac) Activity Tolerance: Patient tolerated treatment well Patient left: in chair;with call bell/phone within reach;with family/visitor present Nurse Communication: Mobility status (christina, RN notified of pt's mobility status, and mobiltiy sheet left hanging in the room.  )         Time: 0350-0938 PT Time Calculation (min) (ACUTE ONLY): 39 min   Charges:   PT Evaluation $PT Eval Low Complexity: 1 Procedure PT Treatments $Gait Training: 23-37 mins   PT G Codes:        Beth Tonetta Napoles, PT, DPT X: [phone]

## 2016-07-18 NOTE — Anesthesia Postprocedure Evaluation (Signed)
Anesthesia Post Note  Patient: Sarah Bridges  Procedure(s) Performed: Procedure(s) (LRB): AMPUTATION TOE right great and second toe (Right)  Patient location during evaluation: Nursing Unit Anesthesia Type: General Level of consciousness: awake and alert and oriented Pain management: pain level controlled Vital Signs Assessment: post-procedure vital signs reviewed and stable Respiratory status: spontaneous breathing Cardiovascular status: stable Postop Assessment: no signs of nausea or vomiting Anesthetic complications: no     Last Vitals:  Vitals:   07/18/16 0200 07/18/16 0617  BP: (!) 168/70 (!) 162/67  Pulse: 69 71  Resp:  20  Temp:  36.7 C    Last Pain:  Vitals:   07/18/16 0942  TempSrc:   PainSc: 0-No pain                 ADAMS, AMY A

## 2016-07-18 NOTE — Addendum Note (Signed)
Addendum  created 07/18/16 1058 by Mickel Baas, CRNA   Sign clinical note

## 2016-07-18 NOTE — Progress Notes (Signed)
PROGRESS NOTE    Sarah Bridges  KZL:[id number] DOB: January 27, 1951 DOA: 07/13/2016 PCP: [name], DO   Brief Narrative:  66 year old female with history of diabetes, chronic kidney disease stage III and hypertension, presents to the hospital with discoloration and foul smell coming from her right great toe for 2 weeks prior to admission. She was found to have dry gangrene of her right great toe with surrounding cellulitis. She was started on antibiotics and underwent amputation on 2/12. She has a wound VAC in place. She'll be continued on intravenous antibiotics for the next 2 weeks. Anticipate discharge home soon.   Assessment & Plan:   Active Problems:   Diabetes mellitus (HCC)   Dry gangrene (HCC)   Hypertension   Anemia   CKD (chronic kidney disease) stage 3, GFR 30-59 ml/min   Gangrene of toe of right foot ([place])   AKI (acute kidney injury) ([place])   1. Dry gangrene of right great toe with surrounding cellulitis. Patient is on intravenous antibiotics with vancomycin and Zosyn. Gen. surgery is following and patient underwent amputation 2/12. She has 1 out of 2 positive blood cultures for Streptococcus Intermedius. Possibly a contaminant. Discussed with infectious disease Dr. Drucilla Schmidt who recommended another 2 weeks of ceftriaxone. Will order PICC line. Continue with wound vac per General Surgery.  2. Uncontrolled diabetes. Continue on Lantus and NovoLog. A1c is 8.1. Blood sugars have improved since admission..  3. AKI on CKD3. Likely had some degree of dehydration with ongoing infection. Improved with hydration. Continue to follow.  4. Anemia, likely related to chronic disease from her diabetic wound. Patient has not had any obvious bleeding. 2 units of PRBCs were transfused on 2/11 in preparation for surgery. Follow up hemoglobins have been stable.  5. Hypertension. Patient was taking ARB/hydrochlorothiazide prior to admission. These have been discontinued due to renal failure.  Blood pressure was trending up. She's been started on Lopressor, Norvasc and hydralazine with improvement of blood pressures..  DVT prophylaxis: lovenox Code Status: full Family Communication: discussed with patient, no family present Disposition Plan: discharge home once improved   Consultants:   General surgery  Procedures:  Transmetatarsal amputation of right great and second toes  Antimicrobials:   Vancomycin 2/8>>2/13  Zosyn 2/8>>2/13  Ceftriaxone 2/13>>2/27   Subjective: No new complaints. No nausea or vomiting.   Objective: Vitals:   07/17/16 2213 07/18/16 0200 07/18/16 0617 07/18/16 1548  BP:  (!) 168/70 (!) 162/67 (!) 167/67  Pulse:  69 71 72  Resp:   20 20  Temp:   98.1 F (36.7 C)   TempSrc:   Oral   SpO2: 98%  99% 99%  Weight:      Height:        Intake/Output Summary (Last 24 hours) at 07/18/16 1621 Last data filed at 07/18/16 1233  Gross per 24 hour  Intake             2025 ml  Output             1300 ml  Net              725 ml   Filed Weights   07/13/16 2159 07/15/16 0544 07/17/16 0641  Weight: 77.8 kg (171 lb 8.3 oz) 77.7 kg (171 lb 6.2 oz) 81.3 kg (179 lb 3.7 oz)    Examination:  General exam: Appears calm and comfortable  Respiratory system: Clear to auscultation. Respiratory effort normal. Cardiovascular system: S1 & S2 heard, RRR. No JVD, murmurs, rubs,  gallops or clicks.  Gastrointestinal system: Abdomen is nondistended, soft and nontender. No organomegaly or masses felt. Normal bowel sounds heard. Central nervous system: Alert and oriented. No focal neurological deficits. Extremities: Symmetric 5 x 5 power. Skin: right foot with wound vac over amputation site and mild erythema around amputation site. Some edema in right foot. Psychiatry: Judgement and insight appear normal. Mood & affect appropriate.     Data Reviewed: I have personally reviewed following labs and imaging studies  CBC:  Recent Labs Lab 07/13/16 1844  07/14/16 0409 07/15/16 0544 07/16/16 0615 07/16/16 2130 07/17/16 0457 07/18/16 0600  WBC 10.5 8.5 6.6 6.1  --  7.2 8.8  NEUTROABS 9.2*  --   --   --   --   --   --   HGB 8.7* 7.6* 8.2* 7.6* 10.8* 10.1* 9.7*  HCT 25.5* 22.0* 22.8* 22.8* 31.6* 29.7* 28.8*  MCV 88.2 88.4 88.7 89.1  --  88.1 89.2  PLT 250 215 234 221  --  204 132   Basic Metabolic Panel:  Recent Labs Lab 07/14/16 0409 07/15/16 0544 07/16/16 0615 07/17/16 0457 07/18/16 0600  NA 132* 136 138 137 138  K 4.6 4.2 4.4 3.9 4.3  CL 101 105 108 105 105  CO2 23 24 24 23 26   GLUCOSE 330* 126* 103* 186* 139*  BUN 34* 23* 16 14 14   CREATININE 1.70* 1.50* 1.35* 1.33* 1.39*  CALCIUM 8.3* 8.3* 8.3* 8.4* 8.4*   GFR: Estimated Creatinine Clearance: 40.4 mL/min (by C-G formula based on SCr of 1.39 mg/dL (H)). Liver Function Tests:  Recent Labs Lab 07/13/16 1844 07/14/16 0409  AST 16 13*  ALT 12* 11*  ALKPHOS 66 57  BILITOT 0.7 0.4  PROT 7.4 6.0*  ALBUMIN 2.6* 2.3*   No results for input(s): LIPASE, AMYLASE in the last 168 hours. No results for input(s): AMMONIA in the last 168 hours. Coagulation Profile: No results for input(s): INR, PROTIME in the last 168 hours. Cardiac Enzymes: No results for input(s): CKTOTAL, CKMB, CKMBINDEX, TROPONINI in the last 168 hours. BNP (last 3 results) No results for input(s): PROBNP in the last 8760 hours. HbA1C: No results for input(s): HGBA1C in the last 72 hours. CBG:  Recent Labs Lab 07/17/16 1122 07/17/16 1634 07/17/16 2148 07/18/16 0823 07/18/16 1124  GLUCAP 134* 291* 374* 109* 171*   Lipid Profile: No results for input(s): CHOL, HDL, LDLCALC, TRIG, CHOLHDL, LDLDIRECT in the last 72 hours. Thyroid Function Tests: No results for input(s): TSH, T4TOTAL, FREET4, T3FREE, THYROIDAB in the last 72 hours. Anemia Panel: No results for input(s): VITAMINB12, FOLATE, FERRITIN, TIBC, IRON, RETICCTPCT in the last 72 hours. Sepsis Labs:  Recent Labs Lab 07/13/16 1849    LATICACIDVEN 1.2    Recent Results (from the past 240 hour(s))  Blood culture (routine x 2)     Status: None   Collection Time: 07/13/16  6:49 PM  Result Value Ref Range Status   Specimen Description RIGHT ANTECUBITAL  Final   Special Requests BOTTLES DRAWN AEROBIC AND ANAEROBIC [facility]  Final   Culture NO GROWTH 5 DAYS  Final   Report Status 07/18/2016 FINAL  Final  Blood culture (routine x 2)     Status: Abnormal   Collection Time: 07/13/16  6:49 PM  Result Value Ref Range Status   Specimen Description LEFT ANTECUBITAL  Final   Special Requests BOTTLES DRAWN AEROBIC AND ANAEROBIC 6CC  Final   Culture  Setup Time   Final    GRAM POSITIVE  COCCI Gram Stain Report Called to,Read Back By and Verified With: NURSE RENEA AT 2257 ON 07/14/2016 BY EVA ANAEROBIC BOTTLE ONLY Performed at [place] ID to follow CRITICAL RESULT CALLED TO, READ BACK BY AND VERIFIED WITH: T HANDY,RN @0533  07/15/16 MKELLY,MLT    Culture (A)  Final    STREPTOCOCCUS INTERMEDIUS THE SIGNIFICANCE OF ISOLATING THIS ORGANISM FROM A SINGLE SET OF BLOOD CULTURES WHEN MULTIPLE SETS ARE DRAWN IS UNCERTAIN. PLEASE NOTIFY THE MICROBIOLOGY DEPARTMENT WITHIN ONE WEEK IF SPECIATION AND SENSITIVITIES ARE REQUIRED. Performed at [facility], [place] [address], [place], [place] [address]    Report Status 07/17/2016 FINAL  Final  Blood Culture ID Panel (Reflexed)     Status: Abnormal   Collection Time: 07/13/16  6:49 PM  Result Value Ref Range Status   Enterococcus species NOT DETECTED NOT DETECTED Final   Listeria monocytogenes NOT DETECTED NOT DETECTED Final   Staphylococcus species NOT DETECTED NOT DETECTED Final   Staphylococcus aureus NOT DETECTED NOT DETECTED Final   Streptococcus species DETECTED (A) NOT DETECTED Final    Comment: Not Enterococcus species, Streptococcus agalactiae, Streptococcus pyogenes, or Streptococcus pneumoniae. CRITICAL RESULT CALLED TO, READ BACK BY AND VERIFIED WITH: T HANDY,RN  @02 /10/18 MKELLY,MLT    Streptococcus agalactiae NOT DETECTED NOT DETECTED Final   Streptococcus pneumoniae NOT DETECTED NOT DETECTED Final   Streptococcus pyogenes NOT DETECTED NOT DETECTED Final   Acinetobacter baumannii NOT DETECTED NOT DETECTED Final   Enterobacteriaceae species NOT DETECTED NOT DETECTED Final   Enterobacter cloacae complex NOT DETECTED NOT DETECTED Final   Escherichia coli NOT DETECTED NOT DETECTED Final   Klebsiella oxytoca NOT DETECTED NOT DETECTED Final   Klebsiella pneumoniae NOT DETECTED NOT DETECTED Final   Proteus species NOT DETECTED NOT DETECTED Final   Serratia marcescens NOT DETECTED NOT DETECTED Final   Haemophilus influenzae NOT DETECTED NOT DETECTED Final   Neisseria meningitidis NOT DETECTED NOT DETECTED Final   Pseudomonas aeruginosa NOT DETECTED NOT DETECTED Final   Candida albicans NOT DETECTED NOT DETECTED Final   Candida glabrata NOT DETECTED NOT DETECTED Final   Candida krusei NOT DETECTED NOT DETECTED Final   Candida parapsilosis NOT DETECTED NOT DETECTED Final   Candida tropicalis NOT DETECTED NOT DETECTED Final    Comment: Performed at [facility], [place] [address] [place], [place] [address]  Aerobic Culture (superficial specimen)     Status: None   Collection Time: 07/13/16  8:34 PM  Result Value Ref Range Status   Specimen Description FOOT RIGHT  Final   Special Requests IMMUNOCOMPROMISED  Final   Gram Stain   Final    MODERATE WBC PRESENT, PREDOMINANTLY PMN ABUNDANT GRAM POSITIVE COCCI IN PAIRS ABUNDANT GRAM NEGATIVE RODS FEW GRAM POSITIVE RODS Performed at [facility], [place] [address] [place], [place] [address]    Culture MULTIPLE ORGANISMS PRESENT, NONE PREDOMINANT  Final   Report Status 07/18/2016 FINAL  Final  Surgical pcr screen     Status: None   Collection Time: 07/17/16  3:58 AM  Result Value Ref Range Status   MRSA, PCR NEGATIVE NEGATIVE Final   Staphylococcus aureus NEGATIVE NEGATIVE Final    Comment:         The Xpert SA Assay (FDA approved for NASAL specimens in patients over 64 years of age), is one component of a comprehensive surveillance program.  Test performance has been validated by [facility] for patients greater than or equal to 40 year old. It is  not intended to diagnose infection nor to guide or monitor treatment.          Radiology Studies: No results found.      Scheduled Meds: . amLODipine  5 mg Oral Daily  . cefTRIAXone (ROCEPHIN)  IV  2 g Intravenous Q24H  . enoxaparin (LOVENOX) injection  40 mg Subcutaneous Q24H  . ferrous sulfate  325 mg Oral BID  . hydrALAZINE  25 mg Oral Q8H  . insulin aspart  0-20 Units Subcutaneous TID WC  . insulin aspart  0-5 Units Subcutaneous QHS  . insulin aspart  4 Units Subcutaneous TID WC  . insulin detemir  30 Units Subcutaneous QPM  . metoprolol tartrate  25 mg Oral BID   Continuous Infusions:    LOS: 4 days    Time spent: 13mins    MEMON,JEHANZEB, MD [facility] Pager [phone]  If 7PM-7AM, please contact night-coverage [email or web address] Password [facility] 07/18/2016, 4:21 PM

## 2016-07-18 NOTE — Progress Notes (Signed)
1 Day Post-Op  Subjective: Patient has no complaints.  Objective: Vital signs in last 24 hours: Temp:  [97 F (36.1 C)-99.1 F (37.3 C)] 98.1 F (36.7 C) (02/13 0617) Pulse Rate:  [65-77] 71 (02/13 0617) Resp:  [9-49] 20 (02/13 0617) BP: (151-193)/(67-84) 162/67 (02/13 0617) SpO2:  [94 %-100 %] 99 % (02/13 0617) Last BM Date: 07/17/16  Intake/Output from previous day: 02/12 0701 - 02/13 0700 In: 2775 [I.V.:2075; IV Piggyback:700] Out: 1100 [Urine:1100] Intake/Output this shift: No intake/output data recorded.  General appearance: alert, cooperative and no distress Extremities: Wound VAC in place, right foot.  Lab Results:   Recent Labs  07/17/16 0457 07/18/16 0600  WBC 7.2 8.8  HGB 10.1* 9.7*  HCT 29.7* 28.8*  PLT 204 180   BMET  Recent Labs  07/17/16 0457 07/18/16 0600  NA 137 138  K 3.9 4.3  CL 105 105  CO2 23 26  GLUCOSE 186* 139*  BUN 14 14  CREATININE 1.33* 1.39*  CALCIUM 8.4* 8.4*   PT/INR No results for input(s): LABPROT, INR in the last 72 hours.  Studies/Results: Dg Chest Port 1 View  Result Date: 07/16/2016 CLINICAL DATA:  Hypertension EXAM: PORTABLE CHEST 1 VIEW COMPARISON:  None. FINDINGS: Lungs are clear.  No pleural effusion or pneumothorax. The heart is top-normal in size. IMPRESSION: No evidence of acute cardiopulmonary disease. Electronically Signed   By: Julian Hy M.D.   On: 07/16/2016 10:33    Anti-infectives: Anti-infectives    Start     Dose/Rate Route Frequency Ordered Stop   07/14/16 1800  vancomycin (VANCOCIN) IVPB 1000 mg/200 mL premix     1,000 mg 200 mL/hr over 60 Minutes Intravenous Every 24 hours 07/13/16 2244     07/14/16 0400  piperacillin-tazobactam (ZOSYN) IVPB 3.375 g     3.375 g 12.5 mL/hr over 240 Minutes Intravenous Every 8 hours 07/13/16 2244     07/14/16 0200  clindamycin (CLEOCIN) IVPB 600 mg  Status:  Discontinued     600 mg 100 mL/hr over 30 Minutes Intravenous Every 6 hours 07/13/16 2220  07/14/16 1449   07/13/16 2230  vancomycin (VANCOCIN) IVPB 1000 mg/200 mL premix  Status:  Discontinued     1,000 mg 200 mL/hr over 60 Minutes Intravenous  Once 07/13/16 2220 07/13/16 2235   07/13/16 1900  vancomycin (VANCOCIN) IVPB 1000 mg/200 mL premix     1,000 mg 200 mL/hr over 60 Minutes Intravenous  Once 07/13/16 1847 07/13/16 2145   07/13/16 1900  clindamycin (CLEOCIN) IVPB 600 mg     600 mg 100 mL/hr over 30 Minutes Intravenous  Once 07/13/16 1847 07/13/16 2047   07/13/16 1900  piperacillin-tazobactam (ZOSYN) IVPB 3.375 g     3.375 g 100 mL/hr over 30 Minutes Intravenous  Once 07/13/16 1847 07/13/16 2006      Assessment/Plan: s/p Procedure(s): AMPUTATION TOE right great and second toe Impression: Stable postoperative day 1. Anticipate discharge tomorrow. Would continue oral antibiotics as per Dr. Roderic Palau. We'll see patient with follow-up in my office in 2 weeks.  LOS: 4 days    Sarah Bridges A 07/18/2016

## 2016-07-19 DIAGNOSIS — I10 Essential (primary) hypertension: Secondary | ICD-10-CM

## 2016-07-19 DIAGNOSIS — I96 Gangrene, not elsewhere classified: Secondary | ICD-10-CM

## 2016-07-19 DIAGNOSIS — N179 Acute kidney failure, unspecified: Secondary | ICD-10-CM

## 2016-07-19 DIAGNOSIS — Z794 Long term (current) use of insulin: Secondary | ICD-10-CM

## 2016-07-19 DIAGNOSIS — E1152 Type 2 diabetes mellitus with diabetic peripheral angiopathy with gangrene: Principal | ICD-10-CM

## 2016-07-19 LAB — GLUCOSE, CAPILLARY
Glucose-Capillary: 83 mg/dL (ref 65–99)
Glucose-Capillary: 150 mg/dL — ABNORMAL HIGH (ref 65–99)

## 2016-07-19 LAB — BASIC METABOLIC PANEL
Anion gap: 8 (ref 5–15)
BUN: 11 mg/dL (ref 6–20)
CHLORIDE: 105 mmol/L (ref 101–111)
CO2: 26 mmol/L (ref 22–32)
CREATININE: 1.36 mg/dL — AB (ref 0.44–1.00)
Calcium: 8.7 mg/dL — ABNORMAL LOW (ref 8.9–10.3)
GFR calc Af Amer: 47 mL/min — ABNORMAL LOW (ref >60)
GFR calc non Af Amer: 40 mL/min — ABNORMAL LOW (ref >60)
GLUCOSE: 81 mg/dL (ref 65–99)
Potassium: 3.8 mmol/L (ref 3.5–5.1)
Sodium: 139 mmol/L (ref 135–145)

## 2016-07-19 MED ORDER — AMLODIPINE BESYLATE 5 MG PO TABS: 10.0000 mg | ORAL_TABLET | Freq: Every day | ORAL | Status: DC

## 2016-07-19 MED ORDER — DEXTROSE 5 % IV SOLN: 2.0000 g | INTRAVENOUS | 0 refills | Status: DC

## 2016-07-19 MED ORDER — MIDAZOLAM HCL 5 MG/5ML IJ SOLN
INTRAMUSCULAR | Status: AC
  Filled 2016-07-19: qty 10

## 2016-07-19 MED ORDER — AMLODIPINE BESYLATE 5 MG PO TABS: 5.0000 mg | ORAL_TABLET | Freq: Every day | ORAL | 1 refills | Status: DC

## 2016-07-19 MED ORDER — HYDRALAZINE HCL 25 MG PO TABS: 25.0000 mg | ORAL_TABLET | Freq: Three times a day (TID) | ORAL | 0 refills | Status: AC

## 2016-07-19 MED ORDER — MEPERIDINE HCL 50 MG/ML IJ SOLN
INTRAMUSCULAR | Status: AC
  Filled 2016-07-19: qty 1

## 2016-07-19 MED ORDER — METOPROLOL TARTRATE 25 MG PO TABS: 25.0000 mg | ORAL_TABLET | Freq: Two times a day (BID) | ORAL | 0 refills | Status: DC

## 2016-07-19 NOTE — Progress Notes (Signed)
[facility] PROGRESS NOTE  Sarah Bridges ZOX:[id number] DOB: Oct 15, 1950 DOA: 07/13/2016 PCP: [name], DO   Active Problems:   Diabetes mellitus ([place])   Dry gangrene ([place])   Hypertension   Anemia   CKD (chronic kidney disease) stage 3, GFR 30-59 ml/min   Gangrene of toe of right foot (HCC)   AKI (acute kidney injury) ([place])  Brief Narrative:  66 year old female with history of diabetes, chronic kidney disease stage III and hypertension, presents to the hospital with discoloration and foul smell coming from her right great toe for 2 weeks prior to admission. She was found to have dry gangrene of her right great toe with surrounding cellulitis. She was started on antibiotics and admitted for further treatment. Underwent right great toe amputation. Cont treatment with wound vanc+iv antibiotics. Planned HHC with iv ceftriaxone at discharge    Assessment & Plan:  Dry gangrene of right great toe with surrounding cellulitis. Patient is on intravenous antibiotics with vancomycin and Zosyn. Gen. surgery is following and patient underwent amputation 2/12. She has 1 out of 2 positive blood cultures for Streptococcus Intermedius. Possibly a contaminant. Discussed with infectious disease Dr. Drucilla Schmidt who recommended another 2 weeks of ceftriaxone.  PICC line is placed on 2/13. Continue with wound vac per General Surgery.  -d/w social work: ongoing discussions with insurance company regarding wound vac coverage. Plan iv ceftriaxone at discharge, wound vac   Uncontrolled diabetes. Continue on Lantus and NovoLog. A1c is 8.1. Blood sugars have improved since admission.. D/c metformin due to renal issues   AKI on CKD3. Likely had some degree of dehydration with ongoing infection. Improved with hydration. Continue to follow.  Anemia, likely related to chronic disease from her diabetic wound. Patient has not had any obvious bleeding. 2 units of PRBCs were transfused on 2/11 in preparation for  surgery. Follow up hemoglobins have been stable.  Hypertension. Patient was taking ARB/hydrochlorothiazide prior to admission. These have been discontinued due to renal failure. Blood pressure was trending up. She's been started on Lopressor, Norvasc (increased on 2/14) and hydralazine with improvement of blood pressures..  Code Status: full Family Communication: d/w patient, her family at the bedside (indicate person spoken with, relationship, and if by phone, the number) Disposition Plan: home soon. Awaiting wound vac, iv antibiotic arrangement, insurance coverage   Consultants:   General surgery  Procedures:  Transmetatarsal amputation of right great and second toes  Antimicrobials:   Vancomycin 2/8>>2/13  Zosyn 2/8>>2/13  Ceftriaxone 2/13>>2/27   HPI/Subjective: Reports feeling well, no complaints, wants to go home   Objective: Vitals:   07/18/16 2115 07/19/16 0543  BP: (!) 175/75 (!) 171/68  Pulse: 70 65  Resp:  20  Temp:  98.4 F (36.9 C)    Intake/Output Summary (Last 24 hours) at 07/19/16 0921 Last data filed at 07/18/16 1730  Gross per 24 hour  Intake              100 ml  Output              400 ml  Net             -300 ml   Filed Weights   07/13/16 2159 07/15/16 0544 07/17/16 0641  Weight: 77.8 kg (171 lb 8.3 oz) 77.7 kg (171 lb 6.2 oz) 81.3 kg (179 lb 3.7 oz)    Exam:   General:  No distress   Cardiovascular: s1,s2 rrr  Respiratory: CTA BL  Abdomen: soft, nt, nd   Musculoskeletal:  post op wound vac    Data Reviewed: Basic Metabolic Panel:  Recent Labs Lab 07/15/16 0544 07/16/16 0615 07/17/16 0457 07/18/16 0600 07/19/16 0502  NA 136 138 137 138 139  K 4.2 4.4 3.9 4.3 3.8  CL 105 108 105 105 105  CO2 24 24 23 26 26   GLUCOSE 126* 103* 186* 139* 81  BUN 23* 16 14 14 11   CREATININE 1.50* 1.35* 1.33* 1.39* 1.36*  CALCIUM 8.3* 8.3* 8.4* 8.4* 8.7*   Liver Function Tests:  Recent Labs Lab 07/13/16 1844 07/14/16 0409  AST 16  13*  ALT 12* 11*  ALKPHOS 66 57  BILITOT 0.7 0.4  PROT 7.4 6.0*  ALBUMIN 2.6* 2.3*   No results for input(s): LIPASE, AMYLASE in the last 168 hours. No results for input(s): AMMONIA in the last 168 hours. CBC:  Recent Labs Lab 07/13/16 1844 07/14/16 0409 07/15/16 0544 07/16/16 0615 07/16/16 2130 07/17/16 0457 07/18/16 0600  WBC 10.5 8.5 6.6 6.1  --  7.2 8.8  NEUTROABS 9.2*  --   --   --   --   --   --   HGB 8.7* 7.6* 8.2* 7.6* 10.8* 10.1* 9.7*  HCT 25.5* 22.0* 22.8* 22.8* 31.6* 29.7* 28.8*  MCV 88.2 88.4 88.7 89.1  --  88.1 89.2  PLT 250 215 234 221  --  204 180   Cardiac Enzymes: No results for input(s): CKTOTAL, CKMB, CKMBINDEX, TROPONINI in the last 168 hours. BNP (last 3 results) No results for input(s): BNP in the last 8760 hours.  ProBNP (last 3 results) No results for input(s): PROBNP in the last 8760 hours.  CBG:  Recent Labs Lab 07/18/16 0823 07/18/16 1124 07/18/16 1715 07/18/16 2057 07/19/16 0741  GLUCAP 109* 171* 142* 159* 83    Recent Results (from the past 240 hour(s))  Blood culture (routine x 2)     Status: None   Collection Time: 07/13/16  6:49 PM  Result Value Ref Range Status   Specimen Description RIGHT ANTECUBITAL  Final   Special Requests BOTTLES DRAWN AEROBIC AND ANAEROBIC 6CC  Final   Culture NO GROWTH 5 DAYS  Final   Report Status 07/18/2016 FINAL  Final  Blood culture (routine x 2)     Status: Abnormal   Collection Time: 07/13/16  6:49 PM  Result Value Ref Range Status   Specimen Description LEFT ANTECUBITAL  Final   Special Requests BOTTLES DRAWN AEROBIC AND ANAEROBIC 6CC  Final   Culture  Setup Time   Final    GRAM POSITIVE COCCI Gram Stain Report Called to,Read Back By and Verified With: NURSE RENEA AT 2257 ON 07/14/2016 BY EVA ANAEROBIC BOTTLE ONLY Performed at [place] ID to follow CRITICAL RESULT CALLED TO, READ BACK BY AND VERIFIED WITH: T HANDY,RN @0533  07/15/16 MKELLY,MLT    Culture (A)  Final     STREPTOCOCCUS INTERMEDIUS THE SIGNIFICANCE OF ISOLATING THIS ORGANISM FROM A SINGLE SET OF BLOOD CULTURES WHEN MULTIPLE SETS ARE DRAWN IS UNCERTAIN. PLEASE NOTIFY THE MICROBIOLOGY DEPARTMENT WITHIN ONE WEEK IF SPECIATION AND SENSITIVITIES ARE REQUIRED. Performed at [facility], [place] [address] [place], [place] [address]    Report Status 07/17/2016 FINAL  Final  Blood Culture ID Panel (Reflexed)     Status: Abnormal   Collection Time: 07/13/16  6:49 PM  Result Value Ref Range Status   Enterococcus species NOT DETECTED NOT DETECTED Final   Listeria monocytogenes NOT DETECTED NOT DETECTED Final   Staphylococcus species NOT  DETECTED NOT DETECTED Final   Staphylococcus aureus NOT DETECTED NOT DETECTED Final   Streptococcus species DETECTED (A) NOT DETECTED Final    Comment: Not Enterococcus species, Streptococcus agalactiae, Streptococcus pyogenes, or Streptococcus pneumoniae. CRITICAL RESULT CALLED TO, READ BACK BY AND VERIFIED WITH: T HANDY,RN @02 /10/18 MKELLY,MLT    Streptococcus agalactiae NOT DETECTED NOT DETECTED Final   Streptococcus pneumoniae NOT DETECTED NOT DETECTED Final   Streptococcus pyogenes NOT DETECTED NOT DETECTED Final   Acinetobacter baumannii NOT DETECTED NOT DETECTED Final   Enterobacteriaceae species NOT DETECTED NOT DETECTED Final   Enterobacter cloacae complex NOT DETECTED NOT DETECTED Final   Escherichia coli NOT DETECTED NOT DETECTED Final   Klebsiella oxytoca NOT DETECTED NOT DETECTED Final   Klebsiella pneumoniae NOT DETECTED NOT DETECTED Final   Proteus species NOT DETECTED NOT DETECTED Final   Serratia marcescens NOT DETECTED NOT DETECTED Final   Haemophilus influenzae NOT DETECTED NOT DETECTED Final   Neisseria meningitidis NOT DETECTED NOT DETECTED Final   Pseudomonas aeruginosa NOT DETECTED NOT DETECTED Final   Candida albicans NOT DETECTED NOT DETECTED Final   Candida glabrata NOT DETECTED NOT DETECTED Final   Candida krusei NOT DETECTED NOT  DETECTED Final   Candida parapsilosis NOT DETECTED NOT DETECTED Final   Candida tropicalis NOT DETECTED NOT DETECTED Final    Comment: Performed at [facility], [place] [address] [place], [place] [address]  Aerobic Culture (superficial specimen)     Status: None   Collection Time: 07/13/16  8:34 PM  Result Value Ref Range Status   Specimen Description FOOT RIGHT  Final   Special Requests IMMUNOCOMPROMISED  Final   Gram Stain   Final    MODERATE WBC PRESENT, PREDOMINANTLY PMN ABUNDANT GRAM POSITIVE COCCI IN PAIRS ABUNDANT GRAM NEGATIVE RODS FEW GRAM POSITIVE RODS Performed at [facility], [place] [address], [place], [place] [address]    Culture MULTIPLE ORGANISMS PRESENT, NONE PREDOMINANT  Final   Report Status 07/18/2016 FINAL  Final  Surgical pcr screen     Status: None   Collection Time: 07/17/16  3:58 AM  Result Value Ref Range Status   MRSA, PCR NEGATIVE NEGATIVE Final   Staphylococcus aureus NEGATIVE NEGATIVE Final    Comment:        The Xpert SA Assay (FDA approved for NASAL specimens in patients over 1 years of age), is one component of a comprehensive surveillance program.  Test performance has been validated by [facility] for patients greater than or equal to 63 year old. It is not intended to diagnose infection nor to guide or monitor treatment.      Studies: No results found.  Scheduled Meds: . amLODipine  5 mg Oral Daily  . cefTRIAXone (ROCEPHIN)  IV  2 g Intravenous Q24H  . enoxaparin (LOVENOX) injection  40 mg Subcutaneous Q24H  . ferrous sulfate  325 mg Oral BID  . hydrALAZINE  25 mg Oral Q8H  . insulin aspart  0-20 Units Subcutaneous TID WC  . insulin aspart  0-5 Units Subcutaneous QHS  . insulin aspart  4 Units Subcutaneous TID WC  . insulin detemir  30 Units Subcutaneous QPM  . metoprolol tartrate  25 mg Oral BID   Continuous Infusions:  Active Problems:   Diabetes mellitus (HCC)   Dry gangrene (HCC)   Hypertension   Anemia    CKD (chronic kidney disease) stage 3, GFR 30-59 ml/min   Gangrene of toe of right foot (HCC)   AKI (acute kidney  injury) ([place])    Time spent: >35 minutes     Kinnie Feil  [facility] Pager [phone]. If 7PM-7AM, please contact night-coverage at [email or web address], password [facility] 07/19/2016, 9:21 AM  LOS: 5 days

## 2016-07-19 NOTE — Discharge Summary (Signed)
Physician Discharge Summary  Sarah Bridges OEV:[id number] DOB: 1951-02-21 DOA: 07/13/2016  PCP: [name], DO  Admit date: 07/13/2016 Discharge date: 07/19/2016  Time spent: >35 minutes  Recommendations for Outpatient Follow-up:  PCP in 5-7 days Surgery in 2 weeks HHC at discharge   Discharge Diagnoses:  Active Problems:   Diabetes mellitus (HCC)   Dry gangrene (HCC)   Hypertension   Anemia   CKD (chronic kidney disease) stage 3, GFR 30-59 ml/min   Gangrene of toe of right foot (HCC)   AKI (acute kidney injury) [facility])   Discharge Condition: stable   Diet recommendation: DM  Filed Weights   07/13/16 2159 07/15/16 0544 07/17/16 0641  Weight: 77.8 kg (171 lb 8.3 oz) 77.7 kg (171 lb 6.2 oz) 81.3 kg (179 lb 3.7 oz)    History of present illness:  66 year old female with history of diabetes, chronic kidney disease stage III and hypertension, presents to the hospital with discoloration and foul smell coming from her right great toe for 2 weeks prior to admission. She was found to have dry gangrene of her right great toe with surrounding cellulitis. She was started on antibiotics and admitted for further treatment. Underwent right great toe amputation. Cont treatment with wound vanc+iv antibiotics. Planned HHC with iv ceftriaxone at discharge   Hospital Course:   Dry gangrene of right great toe with surrounding cellulitis. Patient is on intravenous antibiotics with vancomycin and Zosyn. Gen. surgery is following and patient underwent amputation 2/12.She has 1 out of 2 positive blood cultures for Streptococcus Intermedius. Possibly a contaminant. Discussed with infectious disease Dr. Drucilla Schmidt who recommended another 2 weeks of ceftriaxone.  PICC line is placed on 2/13.  -continues to improve with antibiotic treatment, continue with wound care and f/u per General Surgery. HHC at discharge with follow up labs   Uncontrolled diabetes. Continue on Lantus and NovoLog. A1c is 8.1. Blood  sugars have improved since admission. D/c metformin due to renal issues. D/w patient at length, she is willing to d/w with her PCP regarding ISS with meals    AKI on CKD3. Likely hadsome degree of dehydration with ongoing infection. Improvedwith hydration.   Anemia, likely related to chronic disease from her diabetic wound. Patient has not had any obvious bleeding. 2 units of PRBCs were transfused on 2/11 in preparation for surgery.   Hypertension. Patient was taking ARB/hydrochlorothiazide prior to admission. These have been discontinued due to renal failure.  -She's been started on Lopressor, Norvasc and hydralazine with improvement of blood pressures. Recommended to f/u with PCP next week to titrate meds as needed  Procedures:  Toe amputation  (i.e. Studies not automatically included, echos, thoracentesis, etc; not x-rays)  Consultations:  Surgery   Discharge Exam: Vitals:   07/18/16 2115 07/19/16 0543  BP: (!) 175/75 (!) 171/68  Pulse: 70 65  Resp:  20  Temp:  98.4 F (36.9 C)    General: alert, no distress  Cardiovascular: s1,s2 rrr Respiratory: CTA BL  Discharge Instructions  Discharge Instructions    Diet - low sodium heart healthy    Complete by:  As directed    Discharge instructions    Complete by:  As directed    Please follow up with primary care doctor 5-7 days. F/u with surgery in 2 weeks   Increase activity slowly    Complete by:  As directed      Allergies as of 07/19/2016   No Known Allergies     Medication List  STOP taking these medications   metFORMIN 500 MG tablet Commonly known as:  GLUCOPHAGE   valsartan-hydrochlorothiazide 320-25 MG tablet Commonly known as:  DIOVAN-HCT     TAKE these medications   amLODipine 5 MG tablet Commonly known as:  NORVASC Take 1 tablet (5 mg total) by mouth daily. Start taking on:  07/20/2016   cefTRIAXone 2 g in dextrose 5 % 50 mL Inject 2 g into the vein daily.   ferrous sulfate 325 (65 FE) MG  tablet Take 325 mg by mouth 2 (two) times daily.   glipiZIDE 10 MG tablet Commonly known as:  GLUCOTROL Take 10 mg by mouth 2 (two) times daily.   hydrALAZINE 25 MG tablet Commonly known as:  APRESOLINE Take 1 tablet (25 mg total) by mouth every 8 (eight) hours.   LEVEMIR FLEXPEN 100 UNIT/ML Pen Generic drug:  Insulin Detemir Inject 25 Units into the skin every evening.   metoprolol tartrate 25 MG tablet Commonly known as:  LOPRESSOR Take 1 tablet (25 mg total) by mouth 2 (two) times daily.   Vitamin D 2000 units tablet Take 2,000 Units by mouth daily.      No Known Allergies Follow-up Information    JENKINS,MARK A, MD. Schedule an appointment as soon as possible for a visit in 2 week(s).   Specialty:  General Surgery Why:  Please call to schedule surgical follow-up appointment for Tuesday, 2/27. Contact information: [address] [place] [address] [phone]            The results of significant diagnostics from this hospitalization (including imaging, microbiology, ancillary and laboratory) are listed below for reference.    Significant Diagnostic Studies: US Arterial Seg Single  Result Date: 07/14/2016 CLINICAL DATA:  Dry gangrene of right great toe post fungal nail infection, ischemic changes right second toe, neuropathy EXAM: NONINVASIVE PHYSIOLOGIC VASCULAR STUDY OF BILATERAL LOWER EXTREMITIES TECHNIQUE: Evaluation of both lower extremities were performed at rest, including calculation of ankle-brachial indices with single level Doppler, pressure and pulse volume recording. COMPARISON:  None. FINDINGS: Right ABI:  1.18 Left ABI:  1.13 Right Lower Extremity: Triphasic waveform in the distal posterior tibial artery, biphasic in dorsalis pedis. Left Lower Extremity:  Triphasic waveforms recorded distally. IMPRESSION: No evidence of hemodynamically significant lower extremity arterial occlusive disease at rest. Electronically Signed   By: Lucrezia Europe M.D.    On: 07/14/2016 13:59   Dg Chest Port 1 View  Result Date: 07/16/2016 CLINICAL DATA:  Hypertension EXAM: PORTABLE CHEST 1 VIEW COMPARISON:  None. FINDINGS: Lungs are clear.  No pleural effusion or pneumothorax. The heart is top-normal in size. IMPRESSION: No evidence of acute cardiopulmonary disease. Electronically Signed   By: Julian Hy M.D.   On: 07/16/2016 10:33   Dg Foot Complete Right  Result Date: 07/13/2016 CLINICAL DATA:  Great toe infection EXAM: RIGHT FOOT COMPLETE - 3+ VIEW COMPARISON:  None. FINDINGS: There is subcutaneous gas about the proximal phalanx first digit and distal interphalangeal joint first digit consistent with bacterial infection. No clear osseous erosion identified. No extension of gas beyond the proximal phalanx. IMPRESSION: 1. Subcutaneous along the great toe consistent with gas-forming bacteria. Recommend surgical consultation. 2. No clear evidence of osteomyelitis. Electronically Signed   By: Suzy Bouchard M.D.   On: 07/13/2016 19:51    Microbiology: Recent Results (from the past 240 hour(s))  Blood culture (routine x 2)     Status: None   Collection Time: 07/13/16  6:49 PM  Result Value Ref Range  Status   Specimen Description RIGHT ANTECUBITAL  Final   Special Requests BOTTLES DRAWN AEROBIC AND ANAEROBIC 6CC  Final   Culture NO GROWTH 5 DAYS  Final   Report Status 07/18/2016 FINAL  Final  Blood culture (routine x 2)     Status: Abnormal   Collection Time: 07/13/16  6:49 PM  Result Value Ref Range Status   Specimen Description LEFT ANTECUBITAL  Final   Special Requests BOTTLES DRAWN AEROBIC AND ANAEROBIC 6CC  Final   Culture  Setup Time   Final    GRAM POSITIVE COCCI Gram Stain Report Called to,Read Back By and Verified With: NURSE RENEA AT 2257 ON 07/14/2016 BY EVA ANAEROBIC BOTTLE ONLY Performed at [facility] Organism ID to follow CRITICAL RESULT CALLED TO, READ BACK BY AND VERIFIED WITH: T HANDY,RN @0533  07/15/16 MKELLY,MLT     Culture (A)  Final    STREPTOCOCCUS INTERMEDIUS THE SIGNIFICANCE OF ISOLATING THIS ORGANISM FROM A SINGLE SET OF BLOOD CULTURES WHEN MULTIPLE SETS ARE DRAWN IS UNCERTAIN. PLEASE NOTIFY THE MICROBIOLOGY DEPARTMENT WITHIN ONE WEEK IF SPECIATION AND SENSITIVITIES ARE REQUIRED. Performed at [facility], [place] [address], [place], [place] [address]    Report Status 07/17/2016 FINAL  Final  Blood Culture ID Panel (Reflexed)     Status: Abnormal   Collection Time: 07/13/16  6:49 PM  Result Value Ref Range Status   Enterococcus species NOT DETECTED NOT DETECTED Final   Listeria monocytogenes NOT DETECTED NOT DETECTED Final   Staphylococcus species NOT DETECTED NOT DETECTED Final   Staphylococcus aureus NOT DETECTED NOT DETECTED Final   Streptococcus species DETECTED (A) NOT DETECTED Final    Comment: Not Enterococcus species, Streptococcus agalactiae, Streptococcus pyogenes, or Streptococcus pneumoniae. CRITICAL RESULT CALLED TO, READ BACK BY AND VERIFIED WITH: T HANDY,RN @02 /10/18 MKELLY,MLT    Streptococcus agalactiae NOT DETECTED NOT DETECTED Final   Streptococcus pneumoniae NOT DETECTED NOT DETECTED Final   Streptococcus pyogenes NOT DETECTED NOT DETECTED Final   Acinetobacter baumannii NOT DETECTED NOT DETECTED Final   Enterobacteriaceae species NOT DETECTED NOT DETECTED Final   Enterobacter cloacae complex NOT DETECTED NOT DETECTED Final   Escherichia coli NOT DETECTED NOT DETECTED Final   Klebsiella oxytoca NOT DETECTED NOT DETECTED Final   Klebsiella pneumoniae NOT DETECTED NOT DETECTED Final   Proteus species NOT DETECTED NOT DETECTED Final   Serratia marcescens NOT DETECTED NOT DETECTED Final   Haemophilus influenzae NOT DETECTED NOT DETECTED Final   Neisseria meningitidis NOT DETECTED NOT DETECTED Final   Pseudomonas aeruginosa NOT DETECTED NOT DETECTED Final   Candida albicans NOT DETECTED NOT DETECTED Final   Candida glabrata NOT DETECTED NOT DETECTED Final   Candida  krusei NOT DETECTED NOT DETECTED Final   Candida parapsilosis NOT DETECTED NOT DETECTED Final   Candida tropicalis NOT DETECTED NOT DETECTED Final    Comment: Performed at [facility], [place] [address] [place], [place] [address]  Aerobic Culture (superficial specimen)     Status: None   Collection Time: 07/13/16  8:34 PM  Result Value Ref Range Status   Specimen Description FOOT RIGHT  Final   Special Requests IMMUNOCOMPROMISED  Final   Gram Stain   Final    MODERATE WBC PRESENT, PREDOMINANTLY PMN ABUNDANT GRAM POSITIVE COCCI IN PAIRS ABUNDANT GRAM NEGATIVE RODS FEW GRAM POSITIVE RODS Performed at [facility], [place] [address], [place], [place] [address]    Culture MULTIPLE ORGANISMS PRESENT, NONE PREDOMINANT  Final   Report Status  07/18/2016 FINAL  Final  Surgical pcr screen     Status: None   Collection Time: 07/17/16  3:58 AM  Result Value Ref Range Status   MRSA, PCR NEGATIVE NEGATIVE Final   Staphylococcus aureus NEGATIVE NEGATIVE Final    Comment:        The Xpert SA Assay (FDA approved for NASAL specimens in patients over 47 years of age), is one component of a comprehensive surveillance program.  Test performance has been validated by [facility] for patients greater than or equal to 73 year old. It is not intended to diagnose infection nor to guide or monitor treatment.      Labs: Basic Metabolic Panel:  Recent Labs Lab 07/15/16 0544 07/16/16 0615 07/17/16 0457 07/18/16 0600 07/19/16 0502  NA 136 138 137 138 139  K 4.2 4.4 3.9 4.3 3.8  CL 105 108 105 105 105  CO2 24 24 23 26 26   GLUCOSE 126* 103* 186* 139* 81  BUN 23* 16 14 14 11   CREATININE 1.50* 1.35* 1.33* 1.39* 1.36*  CALCIUM 8.3* 8.3* 8.4* 8.4* 8.7*   Liver Function Tests:  Recent Labs Lab 07/13/16 1844 07/14/16 0409  AST 16 13*  ALT 12* 11*  ALKPHOS 66 57  BILITOT 0.7 0.4  PROT 7.4 6.0*  ALBUMIN 2.6* 2.3*   No results for input(s): LIPASE, AMYLASE in the last 168  hours. No results for input(s): AMMONIA in the last 168 hours. CBC:  Recent Labs Lab 07/13/16 1844 07/14/16 0409 07/15/16 0544 07/16/16 0615 07/16/16 2130 07/17/16 0457 07/18/16 0600  WBC 10.5 8.5 6.6 6.1  --  7.2 8.8  NEUTROABS 9.2*  --   --   --   --   --   --   HGB 8.7* 7.6* 8.2* 7.6* 10.8* 10.1* 9.7*  HCT 25.5* 22.0* 22.8* 22.8* 31.6* 29.7* 28.8*  MCV 88.2 88.4 88.7 89.1  --  88.1 89.2  PLT 250 215 234 221  --  204 180   Cardiac Enzymes: No results for input(s): CKTOTAL, CKMB, CKMBINDEX, TROPONINI in the last 168 hours. BNP: BNP (last 3 results) No results for input(s): BNP in the last 8760 hours.  ProBNP (last 3 results) No results for input(s): PROBNP in the last 8760 hours.  CBG:  Recent Labs Lab 07/18/16 1124 07/18/16 1715 07/18/16 2057 07/19/16 0741 07/19/16 1135  GLUCAP 171* 142* 159* 83 150*       Signed:  Tsutomu Barfoot N  [facility] 07/19/2016, 1:18 PM

## 2016-07-19 NOTE — Progress Notes (Signed)
[place] Hospital Day(s): 5.   Post op day(s): 2 Days Post-Op.   Interval History: Patient seen and examined, no acute events or new complaints overnight. Patient denies pain, fever/chills, CP, or SOB and states that she wants to be discharged home today as anticipated.  Review of Systems:  Constitutional: denies fever, chills  HEENT: denies cough or congestion  Respiratory: denies any shortness of breath  Cardiovascular: denies chest pain or palpitations  Gastrointestinal: denies abdominal pain, N/V, or diarrhea Genitourinary: denies burning with urination or urinary frequency Musculoskeletal: denies pain, decreased motor or sensation Integumentary: denies any other rashes or skin discolorations Neurological: denies HA or vision/hearing changes   Vital signs in last 24 hours: [min-max] current  Temp:  [98.4 F (36.9 C)-99.7 F (37.6 C)] 98.4 F (36.9 C) (02/14 0543) Pulse Rate:  [65-72] 65 (02/14 0543) Resp:  [20] 20 (02/14 0543) BP: (167-175)/(67-75) 171/68 (02/14 0543) SpO2:  [96 %-99 %] 96 % (02/14 0543)     Height: 5\' 2"  (157.5 cm) Weight: 81.3 kg (179 lb 3.7 oz) BMI (Calculated): 31.4   Intake/Output this shift:  No intake/output data recorded.   Intake/Output last 2 shifts:  @IOLAST2SHIFTS @   Physical Exam:  Constitutional: alert, cooperative and no distress  HENT: normocephalic without obvious abnormality  Eyes: PERRL, EOM's grossly intact and symmetric  Neuro: CN II - XII grossly intact and symmetric without deficit  Respiratory: breathing non-labored at rest  Cardiovascular: regular rate and sinus rhythm  Gastrointestinal: soft, non-tender, and non-distended  Musculoskeletal: UE and LE FROM, motor grossly intact, NT, sensation intact to level of B/L ankles (insensate distal to ankles)   Pulse/Doppler Exam: (p=palpable; d=doppler signals; 0=none)     Right   Left   DP  p   p   Labs:  CBC:  Lab Results  Component Value Date   WBC 8.8  07/18/2016   RBC 3.23 (L) 07/18/2016   BMP:  Lab Results  Component Value Date   GLUCOSE 81 07/19/2016   CO2 26 07/19/2016   BUN 11 07/19/2016   CREATININE 1.36 (H) 07/19/2016   CALCIUM 8.7 (L) 07/19/2016     Imaging studies: No new pertinent imaging studies   Assessment/Plan: (ICD-10's: E11.52) 66 y.o. 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   - patient not yet approved by insurance for outpatient negative pressure dressing therapy   - okay to discharge home with wet-to-dry dressing pending insurance evaluation for wound VAC therapy   - outpatient surgical follow-up and discharge instructions added to patient's chart   - antibiotics and medical management as per primary medical team   All of the above findings and recommendations were discussed with the patient, patient's family, the medical team, and case management, and all of patient's and family's questions were answered to their expressed satisfaction.  -- Marilynne Drivers Rosana Hoes, MD, [place]: [facility] Office: [phone]

## 2016-07-19 NOTE — Progress Notes (Signed)
Pt's husband and patient observed while this Probation officer completed a moist to dry dressing change on patient's open wounds to right foot. Verbalized understanding. Dressing supplies provided.

## 2016-07-19 NOTE — Discharge Instructions (Signed)
In addition to included general post-operative instructions for Toe Amputation,  Diet: Resume home heart healthy Diabetic diet.   Activity: Minimize pressure to toe wound. Do not drive or drink alcohol if taking narcotic pain medications.  Wound care: Daily Wet-to-Dry dressing changes as instructed with home health supervision as scheduled. Okay to shower/get incision wet with soapy water when dressing is off and pat dry (do not rub incisions), but no baths or submerging incision underwater until follow-up. Keep dressing clean and dry (whether wet-to-dry or negative pressure VAC dressing).  Medications: Resume all home medications. For mild to moderate pain: acetaminophen (Tylenol). Narcotic pain medications, if prescribed, can be used for severe pain, though may cause nausea, constipation, and drowsiness. Do not combine Tylenol and Percocet within a 6 hour period as Percocet contains Tylenol. If you do not need the narcotic pain medication, you do not need to fill the prescription.  Call office [phone]) at any time if any questions, worsening pain, fevers/chills, bleeding, drainage from incision site, or other concerns.

## 2016-07-19 NOTE — Care Management Note (Signed)
Case Management Note  Patient Details  Name: Sarah Bridges MRN: [id number] Date of Birth: 02/22/1951  Subjective/Objective:   Patient s/p right great toe and second toe amputation. She will go home with IV antibiotics and HH RN provided by [facility]. Still waiting for wound vac authorization. CM spoke with [employer] representative, who will follow up with patient once [employer] makes decision. She has patient's phone number and all demographics. Patient updated.  Patient's husband will be available to learn how to given IV antibiotics. RN will teach patient today how to apply wet to dry dressing prior to discharge as well as [facility] RN will change/teach tomorrow as well.    Action/Plan: Patient will get Rocephin today at 1400 and DC home. HH RN will see patient tomorrow for 1400 dose.    Expected Discharge Date:      07/19/2016            Expected Discharge Plan:  [place]  In-House Referral:  NA  Discharge planning Services  CM Consult  Post Acute Care Choice:  Home Health Choice offered to:     DME Arranged:    DME Agency:     HH Arranged:  RN, IV Antibiotics HH Agency:  [facility]  Status of Service:  Completed, signed off  If discussed at [place] of Stay Meetings, dates discussed:    Additional Comments:  Laquinda Moller, Chauncey Reading, RN 07/19/2016, 10:49 AM

## 2016-07-19 NOTE — Progress Notes (Signed)
Pt discharged home today per Dr. Daleen Bo. Pt's PICC line flushed and WDL for discharge.  Pt's VSS.  Pt provided with home medication list, discharge instructions and prescriptions.  Verbalized understanding.  Pt left floor via WC in stable condition accompanied by NT.

## 2016-08-15 ENCOUNTER — Encounter: Payer: Self-pay | Admitting: General Surgery

## 2016-08-15 ENCOUNTER — Ambulatory Visit: Payer: BC Managed Care – PPO | Admitting: General Surgery

## 2016-08-15 ENCOUNTER — Ambulatory Visit (INDEPENDENT_AMBULATORY_CARE_PROVIDER_SITE_OTHER): Payer: BC Managed Care – PPO | Admitting: General Surgery

## 2016-08-15 VITALS — BP 170/92 | HR 72 | Temp 98.2°F | Resp 18 | Ht 62.0 in | Wt 174.0 lb

## 2016-08-15 DIAGNOSIS — I96 Gangrene, not elsewhere classified: Secondary | ICD-10-CM

## 2016-08-15 NOTE — Progress Notes (Signed)
Subjective:     [place] to do well, status post amputation of right great and second toe. Wound VAC in place. Patient has no complaints. Objective:    BP (!) 170/92   Pulse 72   Temp 98.2 F (36.8 C)   Resp 18   Ht 5\' 2"  (1.575 m)   Wt 174 lb (78.9 kg)   BMI 31.83 kg/m   General:  alert, cooperative and no distress  Right foot: Wound VAC removed, good granulation tissue noted at base of wound. No purulent drainage noted.     Assessment:    Doing well postoperatively.    Plan:  Home health nurse to replace wound VAC is seen ending. She is progressing very well. She now can place weight on the right foot. We'll reassess wound VAC in 2 weeks.

## 2016-08-29 ENCOUNTER — Ambulatory Visit (INDEPENDENT_AMBULATORY_CARE_PROVIDER_SITE_OTHER): Payer: Self-pay | Admitting: General Surgery

## 2016-08-29 ENCOUNTER — Encounter: Payer: Self-pay | Admitting: General Surgery

## 2016-08-29 VITALS — BP 181/80 | HR 68 | Temp 98.2°F | Resp 18 | Ht 62.0 in | Wt 172.0 lb

## 2016-08-29 DIAGNOSIS — Z09 Encounter for follow-up examination after completed treatment for conditions other than malignant neoplasm: Secondary | ICD-10-CM

## 2016-08-29 NOTE — Progress Notes (Signed)
Subjective:     Sarah Bridges    Patient status post amputation of right great toe.  She has been doing well.  Wound VAC has been stopped, and now is applying Xeroform to the wound.  She continues to wear a special boot for ambulation.  She denies any fever or chills. Objective:    BP (!) 181/80   Pulse 68   Temp 98.2 F (36.8 C)   Resp 18   Ht 5\' 2"  (1.575 m)   Wt 172 lb (78 kg)   BMI 31.46 kg/m   General:  alert, cooperative and no distress    Right great toe amputation site healing well with granulation tissue present at the incision site along the sole of foot.  Silver nitrate applied to both areas.  Xeroform reapplied.     Assessment:    Doing well postoperatively.    Plan:    Continue changing Xeroform every 3 days.  We will see follow-up in 3 weeks.  May start weightbearing on right foot.

## 2016-08-30 ENCOUNTER — Encounter (INDEPENDENT_AMBULATORY_CARE_PROVIDER_SITE_OTHER): Payer: BC Managed Care – PPO | Admitting: Ophthalmology

## 2016-08-30 DIAGNOSIS — I10 Essential (primary) hypertension: Secondary | ICD-10-CM | POA: Diagnosis not present

## 2016-08-30 DIAGNOSIS — H35033 Hypertensive retinopathy, bilateral: Secondary | ICD-10-CM | POA: Diagnosis not present

## 2016-08-30 DIAGNOSIS — H43813 Vitreous degeneration, bilateral: Secondary | ICD-10-CM

## 2016-08-30 DIAGNOSIS — E113311 Type 2 diabetes mellitus with moderate nonproliferative diabetic retinopathy with macular edema, right eye: Secondary | ICD-10-CM | POA: Diagnosis not present

## 2016-08-30 DIAGNOSIS — E113592 Type 2 diabetes mellitus with proliferative diabetic retinopathy without macular edema, left eye: Secondary | ICD-10-CM

## 2016-08-30 DIAGNOSIS — E11311 Type 2 diabetes mellitus with unspecified diabetic retinopathy with macular edema: Secondary | ICD-10-CM | POA: Diagnosis not present

## 2016-08-30 DIAGNOSIS — D3131 Benign neoplasm of right choroid: Secondary | ICD-10-CM

## 2016-09-19 ENCOUNTER — Encounter: Payer: Self-pay | Admitting: General Surgery

## 2016-09-19 ENCOUNTER — Ambulatory Visit (INDEPENDENT_AMBULATORY_CARE_PROVIDER_SITE_OTHER): Payer: Self-pay | Admitting: General Surgery

## 2016-09-19 VITALS — BP 183/76 | HR 65 | Temp 97.8°F | Resp 18 | Ht 63.0 in | Wt 178.0 lb

## 2016-09-19 DIAGNOSIS — Z09 Encounter for follow-up examination after completed treatment for conditions other than malignant neoplasm: Secondary | ICD-10-CM

## 2016-09-19 NOTE — Patient Instructions (Signed)
Clean wound with soap and water every other day, then applied triple antibiotic ointment.

## 2016-09-19 NOTE — Progress Notes (Signed)
Subjective:     Sarah Bridges  Status post amputation of right great and second toe. Patient doing very well. No complaints. Objective:    BP (!) 183/76   Pulse 65   Temp 97.8 F (36.6 C)   Resp 18   Ht 5\' 3"  (1.6 m)   Wt 178 lb (80.7 kg)   BMI 31.53 kg/m   General:  alert, cooperative and no distress  Right foot wound healing well by secondary intention. Silver nitrate applied to granulation tissue. Triple antibiotic ointment applied.     Assessment:    Doing well postoperatively. Wound healing by secondary intention.    Plan:   Clean wound every other day with soap and water. Apply triple antibiotic ointment. Follow-up in office in 3 weeks.

## 2016-10-04 ENCOUNTER — Ambulatory Visit (INDEPENDENT_AMBULATORY_CARE_PROVIDER_SITE_OTHER): Payer: Self-pay | Admitting: General Surgery

## 2016-10-04 ENCOUNTER — Encounter: Payer: Self-pay | Admitting: General Surgery

## 2016-10-04 VITALS — BP 189/82 | HR 70 | Temp 97.1°F | Resp 18 | Ht 63.0 in | Wt 182.0 lb

## 2016-10-04 DIAGNOSIS — Z09 Encounter for follow-up examination after completed treatment for conditions other than malignant neoplasm: Secondary | ICD-10-CM

## 2016-10-04 NOTE — Progress Notes (Signed)
Subjective:     Sarah Bridges  Status post right first and second toe amputations. Patient has no complaints. Objective:    BP (!) 189/82   Pulse 70   Temp 97.1 F (36.2 C)   Resp 18   Ht 5\' 3"  (1.6 m)   Wt 182 lb (82.6 kg)   BMI 32.24 kg/m   General:  alert, cooperative and no distress  Right foot with healing amputation site by secondary intention. It is smaller than when I last saw her. A small amount of granulation tissue was present at the base and this was treated with silver nitrate sticks.     Assessment:    Doing well postoperatively.    Plan:  Continue current wound care. Follow-up in 3 weeks in the office.

## 2016-10-10 ENCOUNTER — Ambulatory Visit: Payer: BC Managed Care – PPO | Admitting: General Surgery

## 2016-10-24 ENCOUNTER — Encounter: Payer: Self-pay | Admitting: General Surgery

## 2016-10-24 ENCOUNTER — Ambulatory Visit (INDEPENDENT_AMBULATORY_CARE_PROVIDER_SITE_OTHER): Payer: Self-pay | Admitting: General Surgery

## 2016-10-24 VITALS — BP 190/79 | HR 63 | Temp 97.8°F | Resp 18 | Ht 63.0 in | Wt 184.0 lb

## 2016-10-24 DIAGNOSIS — Z09 Encounter for follow-up examination after completed treatment for conditions other than malignant neoplasm: Secondary | ICD-10-CM

## 2016-10-24 NOTE — Progress Notes (Signed)
Subjective:     DHANYA BOGLE status post amputation of left first and second toes. Doing very well. No drainage noted.  Objective:    BP (!) 190/79   Pulse 63   Temp 97.8 F (36.6 C)   Resp 18   Ht 5\' 3"  (1.6 m)   Wt 184 lb (83.5 kg)   BMI 32.59 kg/m   General:  alert, cooperative and no distress  Left foot wound has healed.     Assessment:    Doing well postoperatively.    Plan:May use regular tennis shoe. follow up here as needed.

## 2016-10-25 ENCOUNTER — Encounter (INDEPENDENT_AMBULATORY_CARE_PROVIDER_SITE_OTHER): Payer: BC Managed Care – PPO | Admitting: Ophthalmology

## 2016-10-25 DIAGNOSIS — H35033 Hypertensive retinopathy, bilateral: Secondary | ICD-10-CM | POA: Diagnosis not present

## 2016-10-25 DIAGNOSIS — E113311 Type 2 diabetes mellitus with moderate nonproliferative diabetic retinopathy with macular edema, right eye: Secondary | ICD-10-CM

## 2016-10-25 DIAGNOSIS — D3131 Benign neoplasm of right choroid: Secondary | ICD-10-CM | POA: Diagnosis not present

## 2016-10-25 DIAGNOSIS — E11311 Type 2 diabetes mellitus with unspecified diabetic retinopathy with macular edema: Secondary | ICD-10-CM | POA: Diagnosis not present

## 2016-10-25 DIAGNOSIS — I10 Essential (primary) hypertension: Secondary | ICD-10-CM

## 2016-10-25 DIAGNOSIS — E113512 Type 2 diabetes mellitus with proliferative diabetic retinopathy with macular edema, left eye: Secondary | ICD-10-CM | POA: Diagnosis not present

## 2016-10-25 DIAGNOSIS — H43813 Vitreous degeneration, bilateral: Secondary | ICD-10-CM | POA: Diagnosis not present

## 2016-11-29 NOTE — Patient Instructions (Signed)
Your procedure is scheduled on: 12/08/2016  Report to [facility] at  640   AM.  Call this number if you have problems the morning of surgery: [phone]   Do not eat food or drink liquids :After Midnight.      Take these medicines the morning of surgery with A SIP OF WATER: amlodipine, metoprolol. Take 1/2 of your usual  insulin dosage the night before your surgery. DO NOT take any medications for diabetes the morning of your surgery.   Do not wear jewelry, make-up or nail polish.  Do not wear lotions, powders, or perfumes. You may wear deodorant.  Do not shave 48 hours prior to surgery.  Do not bring valuables to the hospital.  Contacts, dentures or bridgework may not be worn into surgery.  Leave suitcase in the car. After surgery it may be brought to your room.  For patients admitted to the hospital, checkout time is 11:00 AM the day of discharge.   Patients discharged the day of surgery will not be allowed to drive home.  :     Please read over the following fact sheets that you were given: Coughing and Deep Breathing, Surgical Site Infection Prevention, Anesthesia Post-op Instructions and Care and Recovery After Surgery    Cataract A cataract is a clouding of the lens of the eye. When a lens becomes cloudy, vision is reduced based on the degree and nature of the clouding. Many cataracts reduce vision to some degree. Some cataracts make people more near-sighted as they develop. Other cataracts increase glare. Cataracts that are ignored and become worse can sometimes look white. The white color can be seen through the pupil. CAUSES   Aging. However, cataracts may occur at any age, even in newborns.   Certain drugs.   Trauma to the eye.   Certain diseases such as diabetes.   Specific eye diseases such as chronic inflammation inside the eye or a sudden attack of a rare form of glaucoma.   Inherited or acquired medical problems.  SYMPTOMS   Gradual, progressive drop in vision in  the affected eye.   Severe, rapid visual loss. This most often happens when trauma is the cause.  DIAGNOSIS  To detect a cataract, an eye doctor examines the lens. Cataracts are best diagnosed with an exam of the eyes with the pupils enlarged (dilated) by drops.  TREATMENT  For an early cataract, vision may improve by using different eyeglasses or stronger lighting. If that does not help your vision, surgery is the only effective treatment. A cataract needs to be surgically removed when vision loss interferes with your everyday activities, such as driving, reading, or watching TV. A cataract may also have to be removed if it prevents examination or treatment of another eye problem. Surgery removes the cloudy lens and usually replaces it with a substitute lens (intraocular lens, IOL).  At a time when both you and your doctor agree, the cataract will be surgically removed. If you have cataracts in both eyes, only one is usually removed at a time. This allows the operated eye to heal and be out of danger from any possible problems after surgery (such as infection or poor wound healing). In rare cases, a cataract may be doing damage to your eye. In these cases, your caregiver may advise surgical removal right away. The vast majority of people who have cataract surgery have better vision afterward. HOME CARE INSTRUCTIONS  If you are not planning surgery, you may be asked  to do the following:  Use different eyeglasses.   Use stronger or brighter lighting.   Ask your eye doctor about reducing your medicine dose or changing medicines if it is thought that a medicine caused your cataract. Changing medicines does not make the cataract go away on its own.   Become familiar with your surroundings. Poor vision can lead to injury. Avoid bumping into things on the affected side. You are at a higher risk for tripping or falling.   Exercise extreme care when driving or operating machinery.   Wear sunglasses if  you are sensitive to bright light or experiencing problems with glare.  SEEK IMMEDIATE MEDICAL CARE IF:   You have a worsening or sudden vision loss.   You notice redness, swelling, or increasing pain in the eye.   You have a fever.  Document Released: 05/22/2005 Document Revised: 05/11/2011 Document Reviewed: 01/13/2011 [facility] Patient Information 2012 [place].PATIENT INSTRUCTIONS POST-ANESTHESIA  IMMEDIATELY FOLLOWING SURGERY:  Do not drive or operate machinery for the first twenty four hours after surgery.  Do not make any important decisions for twenty four hours after surgery or while taking narcotic pain medications or sedatives.  If you develop intractable nausea and vomiting or a severe headache please notify your doctor immediately.  FOLLOW-UP:  Please make an appointment with your surgeon as instructed. You do not need to follow up with anesthesia unless specifically instructed to do so.  WOUND CARE INSTRUCTIONS (if applicable):  Keep a dry clean dressing on the anesthesia/puncture wound site if there is drainage.  Once the wound has quit draining you may leave it open to air.  Generally you should leave the bandage intact for twenty four hours unless there is drainage.  If the epidural site drains for more than 36-48 hours please call the anesthesia department.  QUESTIONS?:  Please feel free to call your physician or the hospital operator if you have any questions, and they will be happy to assist you.

## 2016-12-04 ENCOUNTER — Encounter (HOSPITAL_COMMUNITY): Payer: Self-pay

## 2016-12-04 ENCOUNTER — Encounter (HOSPITAL_COMMUNITY)
Admission: RE | Admit: 2016-12-04 | Discharge: 2016-12-04 | Disposition: A | Discharge status: Home / Self Care | Payer: BC Managed Care – PPO | Source: Ambulatory Visit | Attending: Ophthalmology | Admitting: Ophthalmology

## 2016-12-04 DIAGNOSIS — H2512 Age-related nuclear cataract, left eye: Secondary | ICD-10-CM | POA: Diagnosis not present

## 2016-12-04 DIAGNOSIS — Z01812 Encounter for preprocedural laboratory examination: Secondary | ICD-10-CM | POA: Diagnosis not present

## 2016-12-04 DIAGNOSIS — Z01818 Encounter for other preprocedural examination: Secondary | ICD-10-CM | POA: Insufficient documentation

## 2016-12-04 LAB — CBC WITH DIFFERENTIAL/PLATELET
BASOS ABS: 0 10*3/uL (ref 0.0–0.1)
BASOS PCT: 0 %
Eosinophils Absolute: 0.1 10*3/uL (ref 0.0–0.7)
Eosinophils Relative: 3 %
HEMATOCRIT: 32.2 % — AB (ref 36.0–46.0)
HEMOGLOBIN: 11.6 g/dL — AB (ref 12.0–15.0)
Lymphocytes Relative: 34 %
Lymphs Abs: 1.7 10*3/uL (ref 0.7–4.0)
MCH: 30.5 pg (ref 26.0–34.0)
MCHC: 36 g/dL (ref 30.0–36.0)
MCV: 84.7 fL (ref 78.0–100.0)
Monocytes Absolute: 0.2 10*3/uL (ref 0.1–1.0)
Monocytes Relative: 4 %
NEUTROS ABS: 2.9 10*3/uL (ref 1.7–7.7)
NEUTROS PCT: 59 %
Platelets: 109 10*3/uL — ABNORMAL LOW (ref 150–400)
RBC: 3.8 MIL/uL — ABNORMAL LOW (ref 3.87–5.11)
RDW: 13.4 % (ref 11.5–15.5)
WBC: 4.9 10*3/uL (ref 4.0–10.5)

## 2016-12-04 LAB — BASIC METABOLIC PANEL
Anion gap: 8 (ref 5–15)
BUN: 30 mg/dL — AB (ref 6–20)
CO2: 24 mmol/L (ref 22–32)
CREATININE: 1.72 mg/dL — AB (ref 0.44–1.00)
Calcium: 9 mg/dL (ref 8.9–10.3)
Chloride: 103 mmol/L (ref 101–111)
GFR calc Af Amer: 35 mL/min — ABNORMAL LOW (ref >60)
GFR, EST NON AFRICAN AMERICAN: 30 mL/min — AB (ref >60)
GLUCOSE: 290 mg/dL — AB (ref 65–99)
POTASSIUM: 4.4 mmol/L (ref 3.5–5.1)
Sodium: 135 mmol/L (ref 135–145)

## 2016-12-08 ENCOUNTER — Ambulatory Visit (HOSPITAL_COMMUNITY): Payer: BC Managed Care – PPO | Admitting: Anesthesiology

## 2016-12-08 ENCOUNTER — Encounter (HOSPITAL_COMMUNITY): Payer: Self-pay | Admitting: *Deleted

## 2016-12-08 ENCOUNTER — Ambulatory Visit (HOSPITAL_COMMUNITY)
Admission: RE | Admit: 2016-12-08 | Discharge: 2016-12-08 | Disposition: A | Discharge status: Home / Self Care | Payer: BC Managed Care – PPO | Source: Ambulatory Visit | Attending: Ophthalmology | Admitting: Ophthalmology

## 2016-12-08 ENCOUNTER — Encounter (HOSPITAL_COMMUNITY)
Admission: RE | Disposition: A | Discharge status: Home / Self Care | Payer: Self-pay | Source: Ambulatory Visit | Attending: Ophthalmology

## 2016-12-08 DIAGNOSIS — Z6833 Body mass index (BMI) 33.0-33.9, adult: Secondary | ICD-10-CM | POA: Insufficient documentation

## 2016-12-08 DIAGNOSIS — I10 Essential (primary) hypertension: Secondary | ICD-10-CM | POA: Insufficient documentation

## 2016-12-08 DIAGNOSIS — E1142 Type 2 diabetes mellitus with diabetic polyneuropathy: Secondary | ICD-10-CM | POA: Insufficient documentation

## 2016-12-08 DIAGNOSIS — H269 Unspecified cataract: Secondary | ICD-10-CM | POA: Insufficient documentation

## 2016-12-08 DIAGNOSIS — H25812 Combined forms of age-related cataract, left eye: Secondary | ICD-10-CM | POA: Diagnosis not present

## 2016-12-08 HISTORY — PX: CATARACT EXTRACTION W/PHACO: SHX586

## 2016-12-08 LAB — GLUCOSE, CAPILLARY: Glucose-Capillary: 290 mg/dL — ABNORMAL HIGH (ref 65–99)

## 2016-12-08 SURGERY — PHACOEMULSIFICATION, CATARACT, WITH IOL INSERTION
Anesthesia: Monitor Anesthesia Care | Site: Eye | Laterality: Left

## 2016-12-08 MED ORDER — SODIUM HYALURONATE 23 MG/ML IO SOLN
INTRAOCULAR | Status: DC | PRN
  Administered 2016-12-08: 0.6 mL via INTRAOCULAR

## 2016-12-08 MED ORDER — FENTANYL CITRATE (PF) 100 MCG/2ML IJ SOLN
25.0000 ug | Freq: Once | INTRAMUSCULAR | Status: AC
Start: 2016-12-08 — End: 2016-12-08
  Administered 2016-12-08: 25 ug via INTRAVENOUS

## 2016-12-08 MED ORDER — MIDAZOLAM HCL 2 MG/2ML IJ SOLN
1.0000 mg | INTRAMUSCULAR | Status: AC
  Administered 2016-12-08: 2 mg via INTRAVENOUS

## 2016-12-08 MED ORDER — PHENYLEPHRINE HCL 2.5 % OP SOLN
1.0000 [drp] | OPHTHALMIC | Status: AC
  Administered 2016-12-08 (×3): 1 [drp] via OPHTHALMIC

## 2016-12-08 MED ORDER — BSS IO SOLN
INTRAOCULAR | Status: DC | PRN
  Administered 2016-12-08: 500 mL

## 2016-12-08 MED ORDER — PROVISC 10 MG/ML IO SOLN
INTRAOCULAR | Status: DC | PRN
  Administered 2016-12-08: 0.85 mL via INTRAOCULAR

## 2016-12-08 MED ORDER — TETRACAINE HCL 0.5 % OP SOLN
1.0000 [drp] | OPHTHALMIC | Status: AC
  Administered 2016-12-08 (×3): 1 [drp] via OPHTHALMIC

## 2016-12-08 MED ORDER — LIDOCAINE HCL 3.5 % OP GEL
1.0000 "application " | Freq: Once | OPHTHALMIC | Status: AC
  Administered 2016-12-08: 1 via OPHTHALMIC

## 2016-12-08 MED ORDER — NEOMYCIN-POLYMYXIN-DEXAMETH 3.5-10000-0.1 OP SUSP
OPHTHALMIC | Status: DC | PRN
  Administered 2016-12-08: 2 [drp] via OPHTHALMIC

## 2016-12-08 MED ORDER — LACTATED RINGERS IV SOLN
INTRAVENOUS | Status: DC
  Administered 2016-12-08 (×2): via INTRAVENOUS

## 2016-12-08 MED ORDER — LIDOCAINE HCL (PF) 1 % IJ SOLN
INTRAMUSCULAR | Status: DC | PRN
  Administered 2016-12-08: 1 mL via OPHTHALMIC

## 2016-12-08 MED ORDER — FENTANYL CITRATE (PF) 100 MCG/2ML IJ SOLN
INTRAMUSCULAR | Status: AC
  Filled 2016-12-08: qty 2

## 2016-12-08 MED ORDER — BSS IO SOLN
INTRAOCULAR | Status: DC | PRN
  Administered 2016-12-08: 15 mL via INTRAOCULAR

## 2016-12-08 MED ORDER — MIDAZOLAM HCL 2 MG/2ML IJ SOLN
INTRAMUSCULAR | Status: AC
  Filled 2016-12-08: qty 2

## 2016-12-08 MED ORDER — CYCLOPENTOLATE-PHENYLEPHRINE 0.2-1 % OP SOLN
1.0000 [drp] | OPHTHALMIC | Status: AC
  Administered 2016-12-08 (×3): 1 [drp] via OPHTHALMIC

## 2016-12-08 MED ORDER — POVIDONE-IODINE 5 % OP SOLN
OPHTHALMIC | Status: DC | PRN
  Administered 2016-12-08: 1 via OPHTHALMIC

## 2016-12-08 SURGICAL SUPPLY — 15 items
CLOTH BEACON ORANGE TIMEOUT ST (SAFETY) ×1 IMPLANT
EYE SHIELD UNIVERSAL CLEAR (GAUZE/BANDAGES/DRESSINGS) ×1 IMPLANT
GLOVE BIOGEL PI IND STRL 6.5 (GLOVE) IMPLANT
GLOVE BIOGEL PI IND STRL 7.0 (GLOVE) IMPLANT
GLOVE BIOGEL PI INDICATOR 6.5 (GLOVE) ×1
GLOVE BIOGEL PI INDICATOR 7.0 (GLOVE) ×1
LENS ALC ACRYL/TECN (Ophthalmic Related) ×1 IMPLANT
NDL HYPO 18GX1.5 BLUNT FILL (NEEDLE) IMPLANT
NEEDLE HYPO 18GX1.5 BLUNT FILL (NEEDLE) ×2 IMPLANT
PAD ARMBOARD 7.5X6 YLW CONV (MISCELLANEOUS) ×1 IMPLANT
SYR TB 1ML LL NO SAFETY (SYRINGE) ×1 IMPLANT
TAPE SURG TRANSPORE 1 IN (GAUZE/BANDAGES/DRESSINGS) IMPLANT
TAPE SURGICAL TRANSPORE 1 IN (GAUZE/BANDAGES/DRESSINGS) ×1
VISCOELASTIC ADDITIONAL (OPHTHALMIC RELATED) ×2 IMPLANT
WATER STERILE IRR 250ML POUR (IV SOLUTION) ×1 IMPLANT

## 2016-12-08 NOTE — Discharge Instructions (Signed)
Please discharge patient when stable, will follow up today with Dr. Marisa Hua at the [facility] office at 9:10AM.  Leave shield in place until visit.  All paperwork with discharge instructions will be given at the office.  PATIENT INSTRUCTIONS POST-ANESTHESIA  IMMEDIATELY FOLLOWING SURGERY:  Do not drive or operate machinery for the first twenty four hours after surgery.  Do not make any important decisions for twenty four hours after surgery or while taking narcotic pain medications or sedatives.  If you develop intractable nausea and vomiting or a severe headache please notify your doctor immediately.  FOLLOW-UP:  Please make an appointment with your surgeon as instructed. You do not need to follow up with anesthesia unless specifically instructed to do so.  WOUND CARE INSTRUCTIONS (if applicable):  Keep a dry clean dressing on the anesthesia/puncture wound site if there is drainage.  Once the wound has quit draining you may leave it open to air.  Generally you should leave the bandage intact for twenty four hours unless there is drainage.  If the epidural site drains for more than 36-48 hours please call the anesthesia department.  QUESTIONS?:  Please feel free to call your physician or the hospital operator if you have any questions, and they will be happy to assist you.

## 2016-12-08 NOTE — Op Note (Signed)
Date of procedure: 12/08/16  Pre-operative diagnosis: Visually significant cataract, Left Eye  Post-operative diagnosis: Visually significant cataract, Left Eye  Procedure: Removal of cataract via phacoemulsification and insertion of intra-ocular lens AMO PCB00  +20.5D into the capsular bag of the Left Eye  Attending surgeon: Gerda Diss. Chaela Branscum, MD, MA  Anesthesia: MAC, Topical Akten  Complications: None  Estimated Blood Loss: <47m (minimal)  Specimens: None  Implants: As above  Indications:  Visually significant cataract, Left Eye  Procedure:  The patient was seen and identified in the pre-operative area. The operative eye was identified and dilated.  The operative eye was marked.  Topical anesthesia was administered to the operative eye.     The patient was then to the operative suite and placed in the supine position.  A timeout was performed confirming the patient, procedure to be performed, and all other relevant information.   The patient's face was prepped and draped in the usual fashion for intra-ocular surgery.  A lid speculum was placed into the operative eye and the surgical microscope moved into place and focused.  A superotemporal paracentesis was created using a 20 gauge paracentesis blade.  Shugarcaine was injected into the anterior chamber.  Viscoelastic was injected into the anterior chamber.  A temporal clear-corneal main wound incision was created using a 2.48mmicrokeratome.  A continuous curvilinear capsulorrhexis was initiated using an irrigating cystitome and completed using capsulorrhexis forceps.  Hydrodissection and hydrodeliniation were performed.  Viscoelastic was injected into the anterior chamber.  A phacoemulsification handpiece and a chopper as a second instrument were used to remove the nucleus and epinucleus. The irrigation/aspiration handpiece was used to remove any remaining cortical material.   The capsular bag was reinflated with viscoelastic, checked,  and found to be intact.  The intraocular lens was inserted into the capsular bag and dialed into place using a Kuglen hook.  The irrigation/aspiration handpiece was used to remove any remaining viscoelastic.  The clear corneal wound and paracentesis wounds were then hydrated and checked with Weck-Cels to be watertight.  The lid-speculum and drape was removed, and the patient's face was cleaned with a wet and dry 4x4.  Maxitrol was instilled in the eye before a clear shield was taped over the eye. The patient was taken to the post-operative care unit in good condition, having tolerated the procedure well.  Post-Op Instructions: [facility] and will receive all other orders and instructions.

## 2016-12-08 NOTE — Anesthesia Postprocedure Evaluation (Signed)
Anesthesia Post Note  Patient: Sarah Bridges  Procedure(s) Performed: Procedure(s) (LRB): CATARACT EXTRACTION PHACO AND INTRAOCULAR LENS PLACEMENT (IOC) (Left)  Patient location during evaluation: PACU Anesthesia Type: MAC Level of consciousness: awake, awake and alert, oriented and patient cooperative Pain management: pain level controlled Vital Signs Assessment: post-procedure vital signs reviewed and stable Respiratory status: spontaneous breathing, nonlabored ventilation and respiratory function stable Cardiovascular status: stable Postop Assessment: no signs of nausea or vomiting Anesthetic complications: no     Last Vitals:  Vitals:   12/08/16 0755 12/08/16 0837  BP: (!) 163/77 (!) 188/77  Pulse:  69  Resp: (!) 30   Temp:  36.8 C    Last Pain:  Vitals:   12/08/16 0837  TempSrc: Oral                 Pavneet Markwood L

## 2016-12-08 NOTE — Anesthesia Preprocedure Evaluation (Signed)
Anesthesia Evaluation  Patient identified by MRN, date of birth, ID band Patient awake    Reviewed: Allergy & Precautions, NPO status , Patient's Chart, lab work & pertinent test results  Airway Mallampati: II  TM Distance: >3 FB Neck ROM: full    Dental  (+) Teeth Intact   Pulmonary neg pulmonary ROS,    breath sounds clear to auscultation       Cardiovascular hypertension, Pt. on medications and Pt. on home beta blockers  Rhythm:Regular Rate:Normal     Neuro/Psych  Neuromuscular disease ( Peripheral neuropathy )    GI/Hepatic negative GI ROS,   Endo/Other  diabetes, Type 2, Insulin DependentMorbid obesityobese  Renal/GU Renal disease     Musculoskeletal   Abdominal   Peds  Hematology   Anesthesia Other Findings   Reproductive/Obstetrics                             Anesthesia Physical Anesthesia Plan  ASA: III  Anesthesia Plan: MAC   Post-op Pain Management:    Induction: Intravenous  PONV Risk Score and Plan:   Airway Management Planned: Nasal Cannula  Additional Equipment:   Intra-op Plan:   Post-operative Plan:   Informed Consent: I have reviewed the patients History and Physical, chart, labs and discussed the procedure including the risks, benefits and alternatives for the proposed anesthesia with the patient or authorized representative who has indicated his/her understanding and acceptance.     Plan Discussed with:   Anesthesia Plan Comments:         Anesthesia Quick Evaluation  

## 2016-12-08 NOTE — Transfer of Care (Signed)
Immediate Anesthesia Transfer of Care Note  Patient: Sarah Bridges  Procedure(s) Performed: Procedure(s) with comments: CATARACT EXTRACTION PHACO AND INTRAOCULAR LENS PLACEMENT (IOC) (Left) - CDE: 7.51  Patient Location: PACU  Anesthesia Type:MAC  Level of Consciousness: awake, alert , oriented and patient cooperative  Airway & Oxygen Therapy: Patient Spontanous Breathing  Post-op Assessment: Report given to RN and Post -op Vital signs reviewed and stable  Post vital signs: Reviewed and stable 68 NSR, 188/77, 96% on RA, 98.2  Last Vitals:  Vitals:   12/08/16 0750 12/08/16 0755  BP: (!) 165/85 (!) 163/77  Resp: (!) 30 (!) 30  Temp:      Last Pain:  Vitals:   12/08/16 0718  TempSrc: Oral         Complications: No apparent anesthesia complications

## 2016-12-08 NOTE — H&P (Signed)
The H and P was reviewed and updated. The patient was examined.  No changes were found after exam.  The surgical eye was marked.  

## 2016-12-11 ENCOUNTER — Encounter (HOSPITAL_COMMUNITY): Payer: Self-pay | Admitting: Ophthalmology

## 2016-12-15 DIAGNOSIS — H25811 Combined forms of age-related cataract, right eye: Secondary | ICD-10-CM | POA: Diagnosis not present

## 2016-12-20 ENCOUNTER — Encounter (HOSPITAL_COMMUNITY)
Admission: RE | Admit: 2016-12-20 | Discharge: 2016-12-20 | Disposition: A | Discharge status: Home / Self Care | Payer: BC Managed Care – PPO | Source: Ambulatory Visit | Attending: Ophthalmology | Admitting: Ophthalmology

## 2016-12-20 ENCOUNTER — Encounter (INDEPENDENT_AMBULATORY_CARE_PROVIDER_SITE_OTHER): Payer: BC Managed Care – PPO | Admitting: Ophthalmology

## 2016-12-20 ENCOUNTER — Encounter (HOSPITAL_COMMUNITY): Payer: Self-pay

## 2016-12-22 ENCOUNTER — Ambulatory Visit (HOSPITAL_COMMUNITY): Payer: BC Managed Care – PPO | Admitting: Anesthesiology

## 2016-12-22 ENCOUNTER — Encounter (HOSPITAL_COMMUNITY)
Admission: RE | Disposition: A | Discharge status: Home / Self Care | Payer: Self-pay | Source: Ambulatory Visit | Attending: Ophthalmology

## 2016-12-22 ENCOUNTER — Ambulatory Visit (HOSPITAL_COMMUNITY)
Admission: RE | Admit: 2016-12-22 | Discharge: 2016-12-22 | Disposition: A | Discharge status: Home / Self Care | Payer: BC Managed Care – PPO | Source: Ambulatory Visit | Attending: Ophthalmology | Admitting: Ophthalmology

## 2016-12-22 DIAGNOSIS — I10 Essential (primary) hypertension: Secondary | ICD-10-CM | POA: Insufficient documentation

## 2016-12-22 DIAGNOSIS — H269 Unspecified cataract: Secondary | ICD-10-CM | POA: Insufficient documentation

## 2016-12-22 DIAGNOSIS — E1136 Type 2 diabetes mellitus with diabetic cataract: Secondary | ICD-10-CM | POA: Diagnosis not present

## 2016-12-22 DIAGNOSIS — Z79899 Other long term (current) drug therapy: Secondary | ICD-10-CM | POA: Diagnosis not present

## 2016-12-22 DIAGNOSIS — N289 Disorder of kidney and ureter, unspecified: Secondary | ICD-10-CM | POA: Insufficient documentation

## 2016-12-22 DIAGNOSIS — Z7984 Long term (current) use of oral hypoglycemic drugs: Secondary | ICD-10-CM | POA: Insufficient documentation

## 2016-12-22 DIAGNOSIS — E1142 Type 2 diabetes mellitus with diabetic polyneuropathy: Secondary | ICD-10-CM | POA: Diagnosis not present

## 2016-12-22 DIAGNOSIS — H25811 Combined forms of age-related cataract, right eye: Secondary | ICD-10-CM | POA: Diagnosis not present

## 2016-12-22 HISTORY — PX: CATARACT EXTRACTION W/PHACO: SHX586

## 2016-12-22 LAB — GLUCOSE, CAPILLARY: Glucose-Capillary: 139 mg/dL — ABNORMAL HIGH (ref 65–99)

## 2016-12-22 SURGERY — PHACOEMULSIFICATION, CATARACT, WITH IOL INSERTION
Anesthesia: Monitor Anesthesia Care | Site: Eye | Laterality: Right

## 2016-12-22 MED ORDER — NEOMYCIN-POLYMYXIN-DEXAMETH 3.5-10000-0.1 OP SUSP
OPHTHALMIC | Status: DC | PRN
  Administered 2016-12-22: 2 [drp] via OPHTHALMIC

## 2016-12-22 MED ORDER — LIDOCAINE HCL 3.5 % OP GEL
OPHTHALMIC | Status: DC | PRN
  Administered 2016-12-22: 1 via OPHTHALMIC

## 2016-12-22 MED ORDER — EPINEPHRINE PF 1 MG/ML IJ SOLN
INTRAOCULAR | Status: DC | PRN
  Administered 2016-12-22: 500 mL

## 2016-12-22 MED ORDER — LIDOCAINE HCL (PF) 1 % IJ SOLN
INTRAOCULAR | Status: DC | PRN
  Administered 2016-12-22: 1 mL via OPHTHALMIC

## 2016-12-22 MED ORDER — LACTATED RINGERS IV SOLN
INTRAVENOUS | Status: DC
  Administered 2016-12-22: 1000 mL via INTRAVENOUS

## 2016-12-22 MED ORDER — FENTANYL CITRATE (PF) 100 MCG/2ML IJ SOLN
INTRAMUSCULAR | Status: AC
  Filled 2016-12-22: qty 2

## 2016-12-22 MED ORDER — PHENYLEPHRINE HCL 2.5 % OP SOLN
1.0000 [drp] | OPHTHALMIC | Status: AC
  Administered 2016-12-22 (×3): 1 [drp] via OPHTHALMIC

## 2016-12-22 MED ORDER — CYCLOPENTOLATE-PHENYLEPHRINE 0.2-1 % OP SOLN
1.0000 [drp] | OPHTHALMIC | Status: AC
  Administered 2016-12-22 (×3): 1 [drp] via OPHTHALMIC

## 2016-12-22 MED ORDER — PROVISC 10 MG/ML IO SOLN
INTRAOCULAR | Status: DC | PRN
  Administered 2016-12-22: 0.85 mL via INTRAOCULAR

## 2016-12-22 MED ORDER — MIDAZOLAM HCL 2 MG/2ML IJ SOLN
INTRAMUSCULAR | Status: AC
  Filled 2016-12-22: qty 2

## 2016-12-22 MED ORDER — MIDAZOLAM HCL 2 MG/2ML IJ SOLN
1.0000 mg | INTRAMUSCULAR | Status: AC
Start: 2016-12-22 — End: 2016-12-22
  Administered 2016-12-22: 2 mg via INTRAVENOUS

## 2016-12-22 MED ORDER — LIDOCAINE HCL 3.5 % OP GEL: 1.0000 "application " | Freq: Once | OPHTHALMIC | Status: DC

## 2016-12-22 MED ORDER — FENTANYL CITRATE (PF) 100 MCG/2ML IJ SOLN
25.0000 ug | Freq: Once | INTRAMUSCULAR | Status: AC
  Administered 2016-12-22: 25 ug via INTRAVENOUS

## 2016-12-22 MED ORDER — POVIDONE-IODINE 5 % OP SOLN
OPHTHALMIC | Status: DC | PRN
  Administered 2016-12-22: 1 via OPHTHALMIC

## 2016-12-22 MED ORDER — TETRACAINE HCL 0.5 % OP SOLN
1.0000 [drp] | OPHTHALMIC | Status: AC
  Administered 2016-12-22 (×3): 1 [drp] via OPHTHALMIC

## 2016-12-22 MED ORDER — BSS IO SOLN
INTRAOCULAR | Status: DC | PRN
  Administered 2016-12-22: 15 mL via INTRAOCULAR

## 2016-12-22 MED ORDER — SODIUM HYALURONATE 23 MG/ML IO SOLN
INTRAOCULAR | Status: DC | PRN
  Administered 2016-12-22: 0.6 mL via INTRAOCULAR

## 2016-12-22 SURGICAL SUPPLY — 12 items
CLOTH BEACON ORANGE TIMEOUT ST (SAFETY) ×1 IMPLANT
EYE SHIELD UNIVERSAL CLEAR (GAUZE/BANDAGES/DRESSINGS) ×1 IMPLANT
GLOVE BIOGEL PI IND STRL 7.0 (GLOVE) IMPLANT
GLOVE BIOGEL PI INDICATOR 7.0 (GLOVE) ×2
LENS ALC ACRYL/TECN (Ophthalmic Related) ×1 IMPLANT
NEEDLE HYPO 18GX1.5 BLUNT FILL (NEEDLE) ×2 IMPLANT
PAD ARMBOARD 7.5X6 YLW CONV (MISCELLANEOUS) ×1 IMPLANT
SYR TB 1ML LL NO SAFETY (SYRINGE) ×1 IMPLANT
TAPE SURG TRANSPORE 1 IN (GAUZE/BANDAGES/DRESSINGS) IMPLANT
TAPE SURGICAL TRANSPORE 1 IN (GAUZE/BANDAGES/DRESSINGS) ×1
VISCOELASTIC ADDITIONAL (OPHTHALMIC RELATED) ×2 IMPLANT
WATER STERILE IRR 250ML POUR (IV SOLUTION) ×1 IMPLANT

## 2016-12-22 NOTE — Anesthesia Postprocedure Evaluation (Signed)
Anesthesia Post Note  Patient: Sarah Bridges  Procedure(s) Performed: Procedure(s) (LRB): CATARACT EXTRACTION PHACO AND INTRAOCULAR LENS PLACEMENT (IOC) (Right)  Patient location during evaluation: Short Stay Anesthesia Type: MAC Level of consciousness: awake and alert Pain management: pain level controlled Vital Signs Assessment: post-procedure vital signs reviewed and stable Respiratory status: spontaneous breathing Cardiovascular status: stable Postop Assessment: no signs of nausea or vomiting Anesthetic complications: no     Last Vitals:  Vitals:   12/22/16 0800 12/22/16 0828  BP: (!) 163/79 (!) 178/69  Pulse:  65  Resp: (!) 25 18  Temp:  36.4 C    Last Pain:  Vitals:   12/22/16 0828  TempSrc: Oral                 Gabryelle Whitmoyer

## 2016-12-22 NOTE — Anesthesia Preprocedure Evaluation (Signed)
Anesthesia Evaluation  Patient identified by MRN, date of birth, ID band Patient awake    Reviewed: Allergy & Precautions, NPO status , Patient's Chart, lab work & pertinent test results  Airway Mallampati: II  TM Distance: >3 FB Neck ROM: full    Dental  (+) Teeth Intact   Pulmonary neg pulmonary ROS,    breath sounds clear to auscultation       Cardiovascular hypertension, Pt. on medications and Pt. on home beta blockers  Rhythm:Regular Rate:Normal     Neuro/Psych  Neuromuscular disease ( Peripheral neuropathy )    GI/Hepatic negative GI ROS,   Endo/Other  diabetes, Type 2, Insulin DependentMorbid obesityobese  Renal/GU Renal disease     Musculoskeletal   Abdominal   Peds  Hematology   Anesthesia Other Findings   Reproductive/Obstetrics                             Anesthesia Physical Anesthesia Plan  ASA: III  Anesthesia Plan: MAC   Post-op Pain Management:    Induction: Intravenous  PONV Risk Score and Plan:   Airway Management Planned: Nasal Cannula  Additional Equipment:   Intra-op Plan:   Post-operative Plan:   Informed Consent: I have reviewed the patients History and Physical, chart, labs and discussed the procedure including the risks, benefits and alternatives for the proposed anesthesia with the patient or authorized representative who has indicated his/her understanding and acceptance.     Plan Discussed with:   Anesthesia Plan Comments:         Anesthesia Quick Evaluation

## 2016-12-22 NOTE — Discharge Instructions (Signed)
PATIENT INSTRUCTIONS POST-ANESTHESIA  IMMEDIATELY FOLLOWING SURGERY:  Do not drive or operate machinery for the first twenty four hours after surgery.  Do not make any important decisions for twenty four hours after surgery or while taking narcotic pain medications or sedatives.  If you develop intractable nausea and vomiting or a severe headache please notify your doctor immediately.  FOLLOW-UP:  Please make an appointment with your surgeon as instructed. You do not need to follow up with anesthesia unless specifically instructed to do so.  WOUND CARE INSTRUCTIONS (if applicable):  Keep a dry clean dressing on the anesthesia/puncture wound site if there is drainage.  Once the wound has quit draining you may leave it open to air.  Generally you should leave the bandage intact for twenty four hours unless there is drainage.  If the epidural site drains for more than 36-48 hours please call the anesthesia department.  QUESTIONS?:  Please feel free to call your physician or the hospital operator if you have any questions, and they will be happy to assist you.      Please discharge patient when stable, will follow up today with Dr. Marisa Hua at the [facility] office at 9:10AM.  Leave shield in place until visit.  All paperwork with discharge instructions will be given at the office.

## 2016-12-22 NOTE — Anesthesia Procedure Notes (Signed)
Procedure Name: MAC Date/Time: 12/22/2016 8:03 AM Performed by: Vista Deck Pre-anesthesia Checklist: Patient identified, Emergency Drugs available, Suction available, Timeout performed and Patient being monitored Patient Re-evaluated:Patient Re-evaluated prior to induction Oxygen Delivery Method: Nasal Cannula

## 2016-12-22 NOTE — H&P (Signed)
The H and P was reviewed and updated. The patient was examined.  No changes were found after exam.  The surgical eye was marked.  

## 2016-12-22 NOTE — Transfer of Care (Signed)
Immediate Anesthesia Transfer of Care Note  Patient: Sarah Bridges  Procedure(s) Performed: Procedure(s) (LRB): CATARACT EXTRACTION PHACO AND INTRAOCULAR LENS PLACEMENT (IOC) (Right)  Patient Location: Shortstay  Anesthesia Type: MAC  Level of Consciousness: awake  Airway & Oxygen Therapy: Patient Spontanous Breathing   Post-op Assessment: Report given to PACU RN, Post -op Vital signs reviewed and stable and Patient moving all extremities  Post vital signs: Reviewed and stable  Complications: No apparent anesthesia complications

## 2016-12-22 NOTE — Op Note (Signed)
Date of procedure: 12/22/16  Pre-operative diagnosis: Visually significant cataract, Right Eye  Post-operative diagnosis: Visually significant cataract, Right Eye  Procedure: Removal of cataract via phacoemulsification and insertion of intra-ocular lens AMO PCB00  +21.0D into the capsular bag of the Right Eye  Attending surgeon: Gerda Diss. Martrice Apt, MD, MA  Anesthesia: MAC, Topical Akten  Complications: None  Estimated Blood Loss: <108m (minimal)  Specimens: None  Implants: As above  Indications:  Visually significant cataract, Right Eye  Procedure:  The patient was seen and identified in the pre-operative area. The operative eye was identified and dilated.  The operative eye was marked.  Topical anesthesia was administered to the operative eye.     The patient was then to the operative suite and placed in the supine position.  A timeout was performed confirming the patient, procedure to be performed, and all other relevant information.   The patient's face was prepped and draped in the usual fashion for intra-ocular surgery.  A lid speculum was placed into the operative eye and the surgical microscope moved into place and focused.  A superotemporal paracentesis was created using a 20 gauge paracentesis blade.  Shugarcaine was injected into the anterior chamber.  Viscoelastic was injected into the anterior chamber.  A temporal clear-corneal main wound incision was created using a 2.419mmicrokeratome.  A continuous curvilinear capsulorrhexis was initiated using an irrigating cystitome and completed using capsulorrhexis forceps.  Hydrodissection and hydrodeliniation were performed.  Viscoelastic was injected into the anterior chamber.  A phacoemulsification handpiece and a chopper as a second instrument were used to remove the nucleus and epinucleus. The irrigation/aspiration handpiece was used to remove any remaining cortical material.   The capsular bag was reinflated with viscoelastic,  checked, and found to be intact.  The intraocular lens was inserted into the capsular bag and dialed into place using a Kuglen hook.  The irrigation/aspiration handpiece was used to remove any remaining viscoelastic.  The clear corneal wound and paracentesis wounds were then hydrated and checked with Weck-Cels to be watertight.  The lid-speculum and drape was removed, and the patient's face was cleaned with a wet and dry 4x4.  Maxitrol was instilled in the eye before a clear shield was taped over the eye. The patient was taken to the post-operative care unit in good condition, having tolerated the procedure well.  Post-Op Instructions: [facility] and will receive all other orders and instructions.

## 2016-12-25 ENCOUNTER — Encounter (HOSPITAL_COMMUNITY): Payer: Self-pay | Admitting: Ophthalmology

## 2017-01-12 ENCOUNTER — Encounter (INDEPENDENT_AMBULATORY_CARE_PROVIDER_SITE_OTHER): Payer: Medicare HMO | Admitting: Ophthalmology

## 2017-01-12 DIAGNOSIS — H43813 Vitreous degeneration, bilateral: Secondary | ICD-10-CM

## 2017-01-12 DIAGNOSIS — E113512 Type 2 diabetes mellitus with proliferative diabetic retinopathy with macular edema, left eye: Secondary | ICD-10-CM

## 2017-01-12 DIAGNOSIS — I10 Essential (primary) hypertension: Secondary | ICD-10-CM

## 2017-01-12 DIAGNOSIS — E11311 Type 2 diabetes mellitus with unspecified diabetic retinopathy with macular edema: Secondary | ICD-10-CM | POA: Diagnosis not present

## 2017-01-12 DIAGNOSIS — D3131 Benign neoplasm of right choroid: Secondary | ICD-10-CM

## 2017-01-12 DIAGNOSIS — H35033 Hypertensive retinopathy, bilateral: Secondary | ICD-10-CM

## 2017-01-12 DIAGNOSIS — E113311 Type 2 diabetes mellitus with moderate nonproliferative diabetic retinopathy with macular edema, right eye: Secondary | ICD-10-CM | POA: Diagnosis not present

## 2017-01-23 DIAGNOSIS — E782 Mixed hyperlipidemia: Secondary | ICD-10-CM | POA: Diagnosis not present

## 2017-01-23 DIAGNOSIS — Z6832 Body mass index (BMI) 32.0-32.9, adult: Secondary | ICD-10-CM | POA: Diagnosis not present

## 2017-01-23 DIAGNOSIS — Z6833 Body mass index (BMI) 33.0-33.9, adult: Secondary | ICD-10-CM | POA: Diagnosis not present

## 2017-01-23 DIAGNOSIS — I96 Gangrene, not elsewhere classified: Secondary | ICD-10-CM | POA: Diagnosis not present

## 2017-01-23 DIAGNOSIS — E109 Type 1 diabetes mellitus without complications: Secondary | ICD-10-CM | POA: Diagnosis not present

## 2017-01-23 DIAGNOSIS — D508 Other iron deficiency anemias: Secondary | ICD-10-CM | POA: Diagnosis not present

## 2017-01-23 DIAGNOSIS — I10 Essential (primary) hypertension: Secondary | ICD-10-CM | POA: Diagnosis not present

## 2017-01-23 DIAGNOSIS — E559 Vitamin D deficiency, unspecified: Secondary | ICD-10-CM | POA: Diagnosis not present

## 2017-01-29 DIAGNOSIS — R635 Abnormal weight gain: Secondary | ICD-10-CM | POA: Diagnosis not present

## 2017-01-29 DIAGNOSIS — I10 Essential (primary) hypertension: Secondary | ICD-10-CM | POA: Diagnosis not present

## 2017-01-29 DIAGNOSIS — E109 Type 1 diabetes mellitus without complications: Secondary | ICD-10-CM | POA: Diagnosis not present

## 2017-01-29 DIAGNOSIS — E559 Vitamin D deficiency, unspecified: Secondary | ICD-10-CM | POA: Diagnosis not present

## 2017-02-23 ENCOUNTER — Encounter (INDEPENDENT_AMBULATORY_CARE_PROVIDER_SITE_OTHER): Payer: Medicare HMO | Admitting: Ophthalmology

## 2017-02-23 DIAGNOSIS — H43813 Vitreous degeneration, bilateral: Secondary | ICD-10-CM | POA: Diagnosis not present

## 2017-02-23 DIAGNOSIS — I10 Essential (primary) hypertension: Secondary | ICD-10-CM | POA: Diagnosis not present

## 2017-02-23 DIAGNOSIS — D3131 Benign neoplasm of right choroid: Secondary | ICD-10-CM | POA: Diagnosis not present

## 2017-02-23 DIAGNOSIS — E113311 Type 2 diabetes mellitus with moderate nonproliferative diabetic retinopathy with macular edema, right eye: Secondary | ICD-10-CM

## 2017-02-23 DIAGNOSIS — E11311 Type 2 diabetes mellitus with unspecified diabetic retinopathy with macular edema: Secondary | ICD-10-CM

## 2017-02-23 DIAGNOSIS — E113512 Type 2 diabetes mellitus with proliferative diabetic retinopathy with macular edema, left eye: Secondary | ICD-10-CM | POA: Diagnosis not present

## 2017-02-23 DIAGNOSIS — H35033 Hypertensive retinopathy, bilateral: Secondary | ICD-10-CM

## 2017-04-03 IMAGING — DX DG FOOT COMPLETE 3+V*R*
3 series · 3 of 3 positions shown · non-contrast
Comparison: None.

CLINICAL DATA: Great toe infection

EXAM:
RIGHT FOOT COMPLETE - 3+ VIEW

[foot ap]
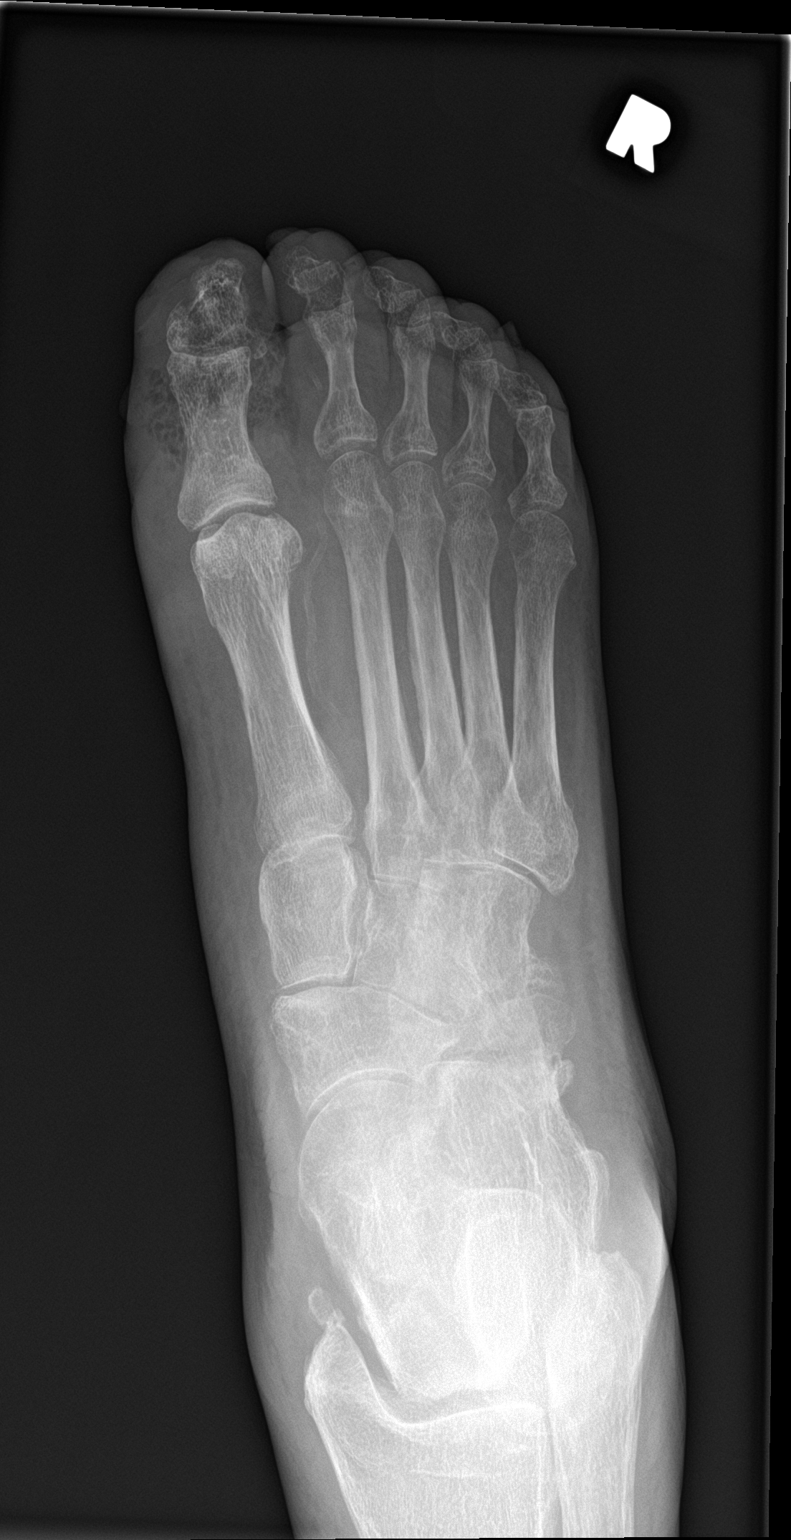

[foot obl]
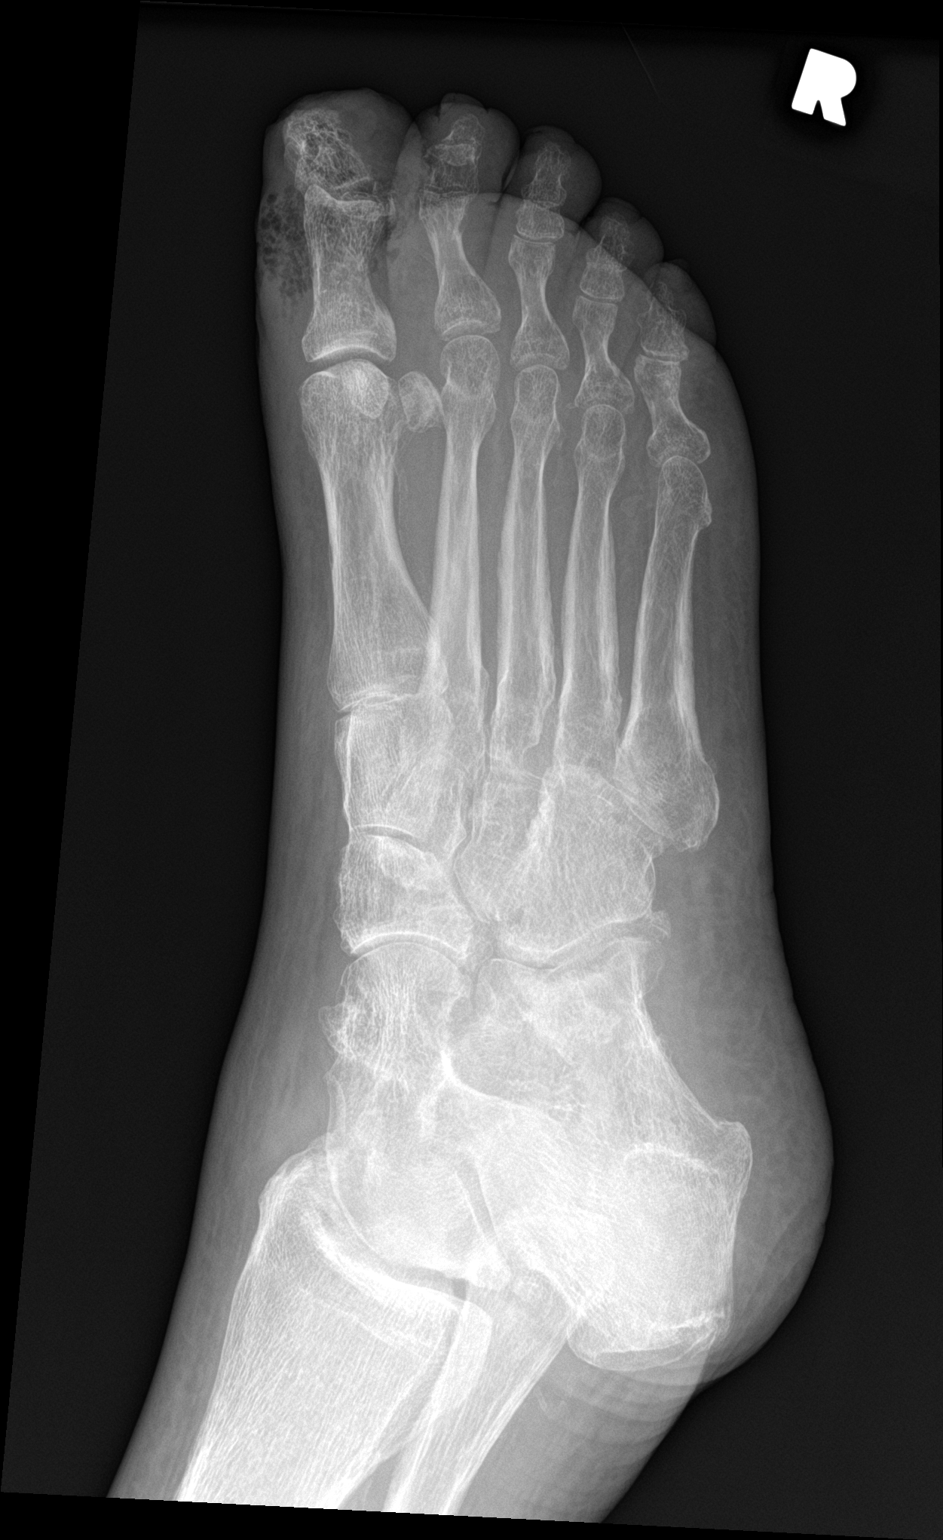

[foot lat]
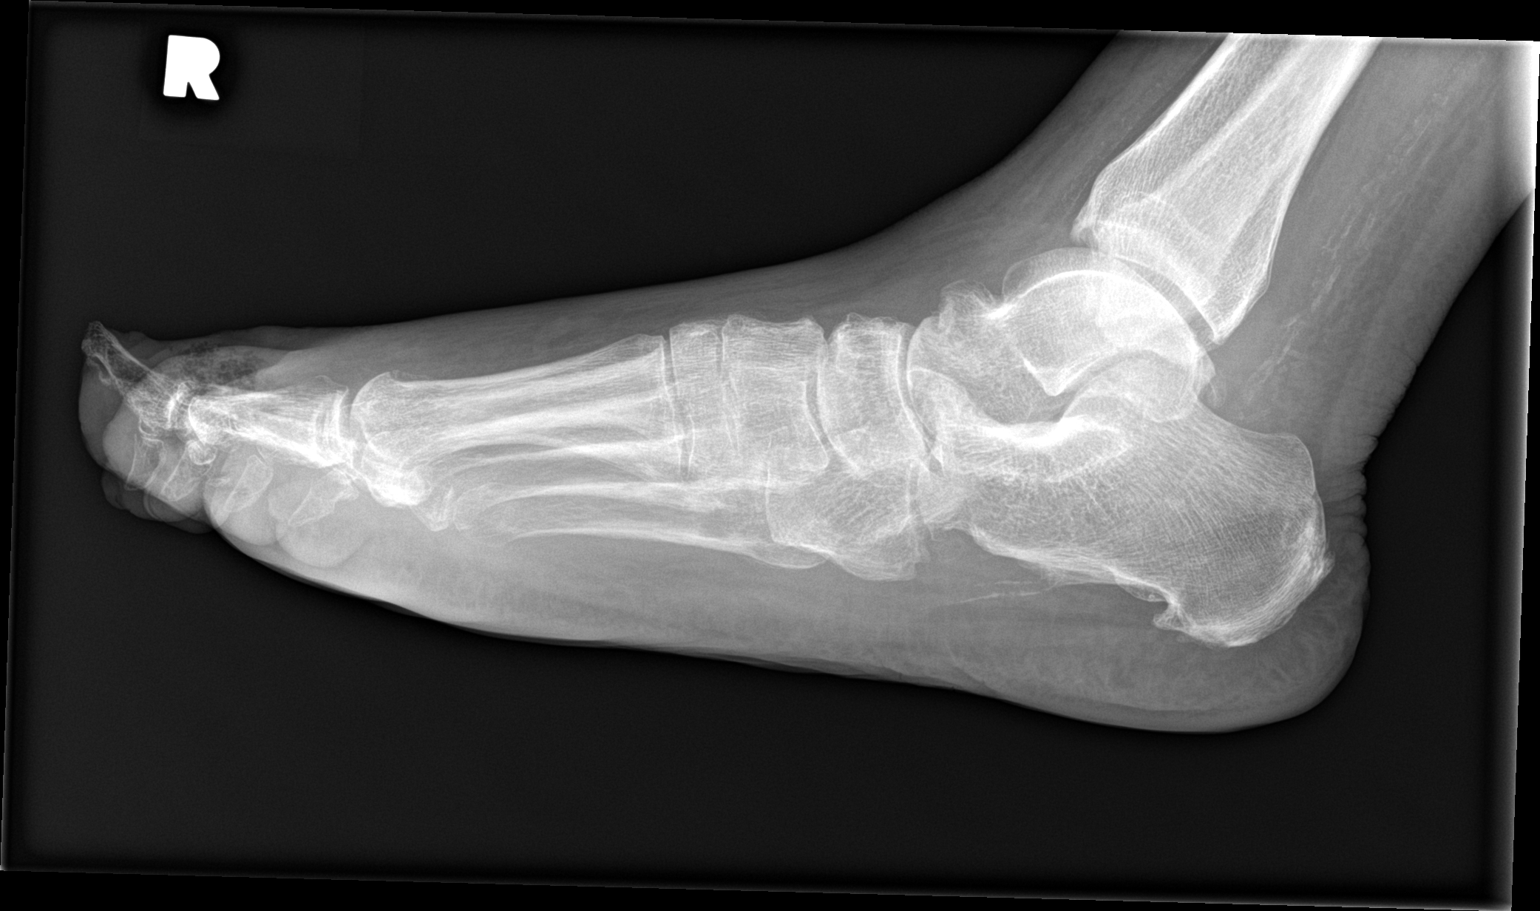

[3 of 3 positions shown; findings below may reference images not displayed]

FINDINGS: There is subcutaneous gas about the proximal phalanx first digit and
distal interphalangeal joint first digit consistent with bacterial
infection. No clear osseous erosion identified. No extension of gas
beyond the proximal phalanx.
IMPRESSION: 1. Subcutaneous along the great toe consistent with gas-forming
bacteria. Recommend surgical consultation.
2. No clear evidence of osteomyelitis.

## 2017-04-06 ENCOUNTER — Encounter (INDEPENDENT_AMBULATORY_CARE_PROVIDER_SITE_OTHER): Payer: Medicare HMO | Admitting: Ophthalmology

## 2017-04-06 DIAGNOSIS — H43813 Vitreous degeneration, bilateral: Secondary | ICD-10-CM | POA: Diagnosis not present

## 2017-04-06 DIAGNOSIS — E11311 Type 2 diabetes mellitus with unspecified diabetic retinopathy with macular edema: Secondary | ICD-10-CM

## 2017-04-06 DIAGNOSIS — H35033 Hypertensive retinopathy, bilateral: Secondary | ICD-10-CM | POA: Diagnosis not present

## 2017-04-06 DIAGNOSIS — E113311 Type 2 diabetes mellitus with moderate nonproliferative diabetic retinopathy with macular edema, right eye: Secondary | ICD-10-CM

## 2017-04-06 DIAGNOSIS — D3131 Benign neoplasm of right choroid: Secondary | ICD-10-CM | POA: Diagnosis not present

## 2017-04-06 DIAGNOSIS — I10 Essential (primary) hypertension: Secondary | ICD-10-CM | POA: Diagnosis not present

## 2017-04-06 DIAGNOSIS — E113512 Type 2 diabetes mellitus with proliferative diabetic retinopathy with macular edema, left eye: Secondary | ICD-10-CM

## 2017-05-18 ENCOUNTER — Encounter (INDEPENDENT_AMBULATORY_CARE_PROVIDER_SITE_OTHER): Payer: Medicare HMO | Admitting: Ophthalmology

## 2017-05-18 DIAGNOSIS — E113512 Type 2 diabetes mellitus with proliferative diabetic retinopathy with macular edema, left eye: Secondary | ICD-10-CM

## 2017-05-18 DIAGNOSIS — E11311 Type 2 diabetes mellitus with unspecified diabetic retinopathy with macular edema: Secondary | ICD-10-CM

## 2017-05-18 DIAGNOSIS — I10 Essential (primary) hypertension: Secondary | ICD-10-CM

## 2017-05-18 DIAGNOSIS — H35033 Hypertensive retinopathy, bilateral: Secondary | ICD-10-CM | POA: Diagnosis not present

## 2017-05-18 DIAGNOSIS — D3131 Benign neoplasm of right choroid: Secondary | ICD-10-CM

## 2017-05-18 DIAGNOSIS — E113311 Type 2 diabetes mellitus with moderate nonproliferative diabetic retinopathy with macular edema, right eye: Secondary | ICD-10-CM

## 2017-05-18 DIAGNOSIS — H43813 Vitreous degeneration, bilateral: Secondary | ICD-10-CM | POA: Diagnosis not present

## 2017-05-24 DIAGNOSIS — D508 Other iron deficiency anemias: Secondary | ICD-10-CM | POA: Diagnosis not present

## 2017-05-24 DIAGNOSIS — I96 Gangrene, not elsewhere classified: Secondary | ICD-10-CM | POA: Diagnosis not present

## 2017-05-24 DIAGNOSIS — E559 Vitamin D deficiency, unspecified: Secondary | ICD-10-CM | POA: Diagnosis not present

## 2017-05-24 DIAGNOSIS — E109 Type 1 diabetes mellitus without complications: Secondary | ICD-10-CM | POA: Diagnosis not present

## 2017-05-24 DIAGNOSIS — E782 Mixed hyperlipidemia: Secondary | ICD-10-CM | POA: Diagnosis not present

## 2017-05-24 DIAGNOSIS — Z6832 Body mass index (BMI) 32.0-32.9, adult: Secondary | ICD-10-CM | POA: Diagnosis not present

## 2017-05-24 DIAGNOSIS — I10 Essential (primary) hypertension: Secondary | ICD-10-CM | POA: Diagnosis not present

## 2017-05-24 DIAGNOSIS — Z6833 Body mass index (BMI) 33.0-33.9, adult: Secondary | ICD-10-CM | POA: Diagnosis not present

## 2017-06-14 DIAGNOSIS — Z961 Presence of intraocular lens: Secondary | ICD-10-CM | POA: Diagnosis not present

## 2017-06-14 DIAGNOSIS — H26492 Other secondary cataract, left eye: Secondary | ICD-10-CM | POA: Diagnosis not present

## 2017-07-02 ENCOUNTER — Encounter (INDEPENDENT_AMBULATORY_CARE_PROVIDER_SITE_OTHER): Payer: Medicare HMO | Admitting: Ophthalmology

## 2017-07-02 DIAGNOSIS — I10 Essential (primary) hypertension: Secondary | ICD-10-CM | POA: Diagnosis not present

## 2017-07-02 DIAGNOSIS — E113512 Type 2 diabetes mellitus with proliferative diabetic retinopathy with macular edema, left eye: Secondary | ICD-10-CM | POA: Diagnosis not present

## 2017-07-02 DIAGNOSIS — H35033 Hypertensive retinopathy, bilateral: Secondary | ICD-10-CM

## 2017-07-02 DIAGNOSIS — H43813 Vitreous degeneration, bilateral: Secondary | ICD-10-CM

## 2017-07-02 DIAGNOSIS — E113311 Type 2 diabetes mellitus with moderate nonproliferative diabetic retinopathy with macular edema, right eye: Secondary | ICD-10-CM | POA: Diagnosis not present

## 2017-07-02 DIAGNOSIS — E11311 Type 2 diabetes mellitus with unspecified diabetic retinopathy with macular edema: Secondary | ICD-10-CM | POA: Diagnosis not present

## 2017-07-16 DIAGNOSIS — I10 Essential (primary) hypertension: Secondary | ICD-10-CM | POA: Diagnosis not present

## 2017-07-16 DIAGNOSIS — E559 Vitamin D deficiency, unspecified: Secondary | ICD-10-CM | POA: Diagnosis not present

## 2017-07-16 DIAGNOSIS — D508 Other iron deficiency anemias: Secondary | ICD-10-CM | POA: Diagnosis not present

## 2017-07-16 DIAGNOSIS — E782 Mixed hyperlipidemia: Secondary | ICD-10-CM | POA: Diagnosis not present

## 2017-07-16 DIAGNOSIS — J029 Acute pharyngitis, unspecified: Secondary | ICD-10-CM | POA: Diagnosis not present

## 2017-07-16 DIAGNOSIS — S98111D Complete traumatic amputation of right great toe, subsequent encounter: Secondary | ICD-10-CM | POA: Diagnosis not present

## 2017-07-16 DIAGNOSIS — E109 Type 1 diabetes mellitus without complications: Secondary | ICD-10-CM | POA: Diagnosis not present

## 2017-08-13 ENCOUNTER — Encounter (INDEPENDENT_AMBULATORY_CARE_PROVIDER_SITE_OTHER): Payer: Medicare HMO | Admitting: Ophthalmology

## 2017-08-13 DIAGNOSIS — E113512 Type 2 diabetes mellitus with proliferative diabetic retinopathy with macular edema, left eye: Secondary | ICD-10-CM | POA: Diagnosis not present

## 2017-08-13 DIAGNOSIS — D3131 Benign neoplasm of right choroid: Secondary | ICD-10-CM

## 2017-08-13 DIAGNOSIS — E113311 Type 2 diabetes mellitus with moderate nonproliferative diabetic retinopathy with macular edema, right eye: Secondary | ICD-10-CM

## 2017-08-13 DIAGNOSIS — H43813 Vitreous degeneration, bilateral: Secondary | ICD-10-CM

## 2017-08-13 DIAGNOSIS — H35033 Hypertensive retinopathy, bilateral: Secondary | ICD-10-CM

## 2017-08-13 DIAGNOSIS — I10 Essential (primary) hypertension: Secondary | ICD-10-CM

## 2017-08-13 DIAGNOSIS — E11311 Type 2 diabetes mellitus with unspecified diabetic retinopathy with macular edema: Secondary | ICD-10-CM | POA: Diagnosis not present

## 2017-08-21 DIAGNOSIS — E782 Mixed hyperlipidemia: Secondary | ICD-10-CM | POA: Diagnosis not present

## 2017-08-21 DIAGNOSIS — I10 Essential (primary) hypertension: Secondary | ICD-10-CM | POA: Diagnosis not present

## 2017-08-21 DIAGNOSIS — E109 Type 1 diabetes mellitus without complications: Secondary | ICD-10-CM | POA: Diagnosis not present

## 2017-08-21 DIAGNOSIS — D508 Other iron deficiency anemias: Secondary | ICD-10-CM | POA: Diagnosis not present

## 2017-09-17 ENCOUNTER — Encounter (INDEPENDENT_AMBULATORY_CARE_PROVIDER_SITE_OTHER): Payer: Medicare HMO | Admitting: Ophthalmology

## 2017-09-17 DIAGNOSIS — E113512 Type 2 diabetes mellitus with proliferative diabetic retinopathy with macular edema, left eye: Secondary | ICD-10-CM | POA: Diagnosis not present

## 2017-09-17 DIAGNOSIS — I10 Essential (primary) hypertension: Secondary | ICD-10-CM

## 2017-09-17 DIAGNOSIS — H35372 Puckering of macula, left eye: Secondary | ICD-10-CM | POA: Diagnosis not present

## 2017-09-17 DIAGNOSIS — H43813 Vitreous degeneration, bilateral: Secondary | ICD-10-CM

## 2017-09-17 DIAGNOSIS — E11311 Type 2 diabetes mellitus with unspecified diabetic retinopathy with macular edema: Secondary | ICD-10-CM | POA: Diagnosis not present

## 2017-09-17 DIAGNOSIS — E113311 Type 2 diabetes mellitus with moderate nonproliferative diabetic retinopathy with macular edema, right eye: Secondary | ICD-10-CM

## 2017-09-17 DIAGNOSIS — D3131 Benign neoplasm of right choroid: Secondary | ICD-10-CM | POA: Diagnosis not present

## 2017-09-17 DIAGNOSIS — H35033 Hypertensive retinopathy, bilateral: Secondary | ICD-10-CM

## 2017-10-22 ENCOUNTER — Encounter (INDEPENDENT_AMBULATORY_CARE_PROVIDER_SITE_OTHER): Payer: Medicare HMO | Admitting: Ophthalmology

## 2017-10-22 DIAGNOSIS — H43813 Vitreous degeneration, bilateral: Secondary | ICD-10-CM

## 2017-10-22 DIAGNOSIS — E113592 Type 2 diabetes mellitus with proliferative diabetic retinopathy without macular edema, left eye: Secondary | ICD-10-CM | POA: Diagnosis not present

## 2017-10-22 DIAGNOSIS — E113391 Type 2 diabetes mellitus with moderate nonproliferative diabetic retinopathy without macular edema, right eye: Secondary | ICD-10-CM

## 2017-10-22 DIAGNOSIS — H35033 Hypertensive retinopathy, bilateral: Secondary | ICD-10-CM

## 2017-10-22 DIAGNOSIS — E11319 Type 2 diabetes mellitus with unspecified diabetic retinopathy without macular edema: Secondary | ICD-10-CM

## 2017-10-22 DIAGNOSIS — I10 Essential (primary) hypertension: Secondary | ICD-10-CM | POA: Diagnosis not present

## 2017-11-21 DIAGNOSIS — J069 Acute upper respiratory infection, unspecified: Secondary | ICD-10-CM | POA: Diagnosis not present

## 2017-11-21 DIAGNOSIS — I10 Essential (primary) hypertension: Secondary | ICD-10-CM | POA: Diagnosis not present

## 2017-11-21 DIAGNOSIS — R05 Cough: Secondary | ICD-10-CM | POA: Diagnosis not present

## 2017-11-21 DIAGNOSIS — D508 Other iron deficiency anemias: Secondary | ICD-10-CM | POA: Diagnosis not present

## 2017-11-21 DIAGNOSIS — E559 Vitamin D deficiency, unspecified: Secondary | ICD-10-CM | POA: Diagnosis not present

## 2017-11-21 DIAGNOSIS — E109 Type 1 diabetes mellitus without complications: Secondary | ICD-10-CM | POA: Diagnosis not present

## 2017-11-21 DIAGNOSIS — E782 Mixed hyperlipidemia: Secondary | ICD-10-CM | POA: Diagnosis not present

## 2017-11-26 ENCOUNTER — Encounter (INDEPENDENT_AMBULATORY_CARE_PROVIDER_SITE_OTHER): Payer: Medicare HMO | Admitting: Ophthalmology

## 2017-11-26 DIAGNOSIS — E11311 Type 2 diabetes mellitus with unspecified diabetic retinopathy with macular edema: Secondary | ICD-10-CM

## 2017-11-26 DIAGNOSIS — I10 Essential (primary) hypertension: Secondary | ICD-10-CM

## 2017-11-26 DIAGNOSIS — H35033 Hypertensive retinopathy, bilateral: Secondary | ICD-10-CM | POA: Diagnosis not present

## 2017-11-26 DIAGNOSIS — E113311 Type 2 diabetes mellitus with moderate nonproliferative diabetic retinopathy with macular edema, right eye: Secondary | ICD-10-CM | POA: Diagnosis not present

## 2017-11-26 DIAGNOSIS — D3131 Benign neoplasm of right choroid: Secondary | ICD-10-CM

## 2017-11-26 DIAGNOSIS — E113592 Type 2 diabetes mellitus with proliferative diabetic retinopathy without macular edema, left eye: Secondary | ICD-10-CM

## 2017-11-26 DIAGNOSIS — H43813 Vitreous degeneration, bilateral: Secondary | ICD-10-CM | POA: Diagnosis not present

## 2018-01-03 DIAGNOSIS — E109 Type 1 diabetes mellitus without complications: Secondary | ICD-10-CM | POA: Diagnosis not present

## 2018-01-03 DIAGNOSIS — E782 Mixed hyperlipidemia: Secondary | ICD-10-CM | POA: Diagnosis not present

## 2018-01-03 DIAGNOSIS — E559 Vitamin D deficiency, unspecified: Secondary | ICD-10-CM | POA: Diagnosis not present

## 2018-01-03 DIAGNOSIS — I10 Essential (primary) hypertension: Secondary | ICD-10-CM | POA: Diagnosis not present

## 2018-01-14 ENCOUNTER — Encounter (INDEPENDENT_AMBULATORY_CARE_PROVIDER_SITE_OTHER): Payer: Medicare HMO | Admitting: Ophthalmology

## 2018-01-16 ENCOUNTER — Encounter (INDEPENDENT_AMBULATORY_CARE_PROVIDER_SITE_OTHER): Payer: Medicare HMO | Admitting: Ophthalmology

## 2018-01-16 DIAGNOSIS — I10 Essential (primary) hypertension: Secondary | ICD-10-CM | POA: Diagnosis not present

## 2018-01-16 DIAGNOSIS — E11311 Type 2 diabetes mellitus with unspecified diabetic retinopathy with macular edema: Secondary | ICD-10-CM | POA: Diagnosis not present

## 2018-01-16 DIAGNOSIS — H4312 Vitreous hemorrhage, left eye: Secondary | ICD-10-CM | POA: Diagnosis present

## 2018-01-16 DIAGNOSIS — E113311 Type 2 diabetes mellitus with moderate nonproliferative diabetic retinopathy with macular edema, right eye: Secondary | ICD-10-CM

## 2018-01-16 DIAGNOSIS — H35033 Hypertensive retinopathy, bilateral: Secondary | ICD-10-CM | POA: Diagnosis not present

## 2018-01-16 DIAGNOSIS — E113592 Type 2 diabetes mellitus with proliferative diabetic retinopathy without macular edema, left eye: Secondary | ICD-10-CM | POA: Diagnosis not present

## 2018-01-16 DIAGNOSIS — H3412 Central retinal artery occlusion, left eye: Secondary | ICD-10-CM | POA: Diagnosis not present

## 2018-01-16 NOTE — H&P (Signed)
Sarah Bridges is an 67 y.o. female.   Chief Complaint: Loss of vision left eye for one week HPI: Proliferative diabetic with sudden vitreous hemorrhage left eye  Past Medical History:  Diagnosis Date  . Avulsion fracture    right foot  . DM (diabetes mellitus) ([place])    Type II  . Hyperlipidemia   . Hypertension   . Peripheral neuropathy   . Vitamin D deficiency   . Wears glasses     Past Surgical History:  Procedure Laterality Date  . AMPUTATION TOE Right 07/17/2016   Procedure: AMPUTATION TOE right great and second toe;  Surgeon: Aviva Signs, MD;  Location: [facility];  Service: General;  Laterality: Right;  great and second toe, right  . CATARACT EXTRACTION W/PHACO Left 12/08/2016   Procedure: CATARACT EXTRACTION PHACO AND INTRAOCULAR LENS PLACEMENT (IOC);  Surgeon: Baruch Goldmann, MD;  Location: [facility];  Service: Ophthalmology;  Laterality: Left;  CDE: 7.51  . CATARACT EXTRACTION W/PHACO Right 12/22/2016   Procedure: CATARACT EXTRACTION PHACO AND INTRAOCULAR LENS PLACEMENT (IOC);  Surgeon: Baruch Goldmann, MD;  Location: [facility];  Service: Ophthalmology;  Laterality: Right;  CDE: 4.76  . COLONOSCOPY  01/17/2012   Procedure: COLONOSCOPY;  Surgeon: Daneil Dolin, MD;  Location: [facility];  Service: Endoscopy;  Laterality: N/A;  8:30  . HARDWARE REMOVAL Right 08/21/2014   Procedure: Irrigation and Debridement Right Foot, Removal Fixation and Place Antibiotic Beads;  Surgeon: Newt Minion, MD;  Location: [place];  Service: Orthopedics;  Laterality: Right;  . OPEN REDUCTION INTERNAL FIXATION (ORIF) FOOT LISFRANC FRACTURE Right 07/31/2014   Procedure: OPEN REDUCTION INTERNAL FIXATION (ORIF) FOOT NAVICULAR FRACTURE;  Surgeon: Newt Minion, MD;  Location: [place];  Service: Orthopedics;  Laterality: Right;  . piloninal cyst     age 15  . TONSILLECTOMY    . TUBAL LIGATION  1976    Family History  Problem Relation Age of Onset  . Emphysema Mother        deceased  . Liver disease Neg Hx    . Colon cancer Neg Hx    Social History:  reports that she has never smoked. She has never used smokeless tobacco. She reports that she does not drink alcohol or use drugs.  Allergies: No Known Allergies  No medications prior to admission.    Review of systems otherwise negative  There were no vitals taken for this visit.  Physical exam: Mental status: oriented x3. Eyes: See eye exam associated with this date of surgery in media tab.  Scanned in by scanning center Ears, Nose, Throat: within normal limits Neck: Within Normal limits General: within normal limits Chest: Within normal limits Breast: deferred Heart: Within normal limits Abdomen: Within normal limits GU: deferred Extremities: within normal limits Skin: within normal limits  Assessment/Plan Vitreous hemorrhage,  Proliferative diabetic retinopathy left eye Plan: To [facility] for Pars plana vitrectomy, membrane peel, laser, gas injection left eye  Hayden Pedro 01/16/2018, 11:31 AM

## 2018-02-11 ENCOUNTER — Other Ambulatory Visit: Payer: Self-pay

## 2018-02-11 ENCOUNTER — Encounter (HOSPITAL_COMMUNITY): Payer: Self-pay | Admitting: *Deleted

## 2018-02-11 NOTE — Progress Notes (Signed)
Mrs Sarah Bridges denies chest pain or shortness of breath Patient is a Type II diabetic, she reports that last A1C was 8.0 and that fasting CBG averages 140. Patient does not check CBG every day. Dr Zigmund Daniel instructed patient to not take any medications in am. Dr Zigmund Daniel did not have patient adjust hs Insulin. I instructed patient to check CBG after awaking and every 2 hours until arrival  to the hospital.  I Instructed patient if CBG is less than 70 to take 4 Glucose Tablets  Recheck CBG in 15 minutes then call pre- op desk at [phone] for further instructions.

## 2018-02-12 ENCOUNTER — Encounter (HOSPITAL_COMMUNITY)
Admission: RE | Disposition: A | Discharge status: Home / Self Care | Payer: Self-pay | Source: Ambulatory Visit | Attending: Ophthalmology

## 2018-02-12 ENCOUNTER — Ambulatory Visit (HOSPITAL_COMMUNITY)
Admission: RE | Admit: 2018-02-12 | Discharge: 2018-02-13 | Disposition: A | Discharge status: Home / Self Care | Payer: Medicare HMO | Source: Ambulatory Visit | Attending: Ophthalmology | Admitting: Ophthalmology

## 2018-02-12 ENCOUNTER — Encounter (HOSPITAL_COMMUNITY): Payer: Self-pay | Admitting: *Deleted

## 2018-02-12 ENCOUNTER — Ambulatory Visit (HOSPITAL_COMMUNITY): Payer: Medicare HMO | Admitting: Anesthesiology

## 2018-02-12 DIAGNOSIS — E559 Vitamin D deficiency, unspecified: Secondary | ICD-10-CM | POA: Diagnosis not present

## 2018-02-12 DIAGNOSIS — I129 Hypertensive chronic kidney disease with stage 1 through stage 4 chronic kidney disease, or unspecified chronic kidney disease: Secondary | ICD-10-CM | POA: Diagnosis not present

## 2018-02-12 DIAGNOSIS — Z9841 Cataract extraction status, right eye: Secondary | ICD-10-CM | POA: Insufficient documentation

## 2018-02-12 DIAGNOSIS — Z6834 Body mass index (BMI) 34.0-34.9, adult: Secondary | ICD-10-CM | POA: Diagnosis not present

## 2018-02-12 DIAGNOSIS — Z9842 Cataract extraction status, left eye: Secondary | ICD-10-CM | POA: Insufficient documentation

## 2018-02-12 DIAGNOSIS — H35032 Hypertensive retinopathy, left eye: Secondary | ICD-10-CM | POA: Insufficient documentation

## 2018-02-12 DIAGNOSIS — Z89411 Acquired absence of right great toe: Secondary | ICD-10-CM | POA: Insufficient documentation

## 2018-02-12 DIAGNOSIS — E113592 Type 2 diabetes mellitus with proliferative diabetic retinopathy without macular edema, left eye: Secondary | ICD-10-CM | POA: Insufficient documentation

## 2018-02-12 DIAGNOSIS — Z89421 Acquired absence of other right toe(s): Secondary | ICD-10-CM | POA: Insufficient documentation

## 2018-02-12 DIAGNOSIS — E785 Hyperlipidemia, unspecified: Secondary | ICD-10-CM | POA: Insufficient documentation

## 2018-02-12 DIAGNOSIS — E669 Obesity, unspecified: Secondary | ICD-10-CM | POA: Diagnosis not present

## 2018-02-12 DIAGNOSIS — N183 Chronic kidney disease, stage 3 (moderate): Secondary | ICD-10-CM | POA: Diagnosis not present

## 2018-02-12 DIAGNOSIS — Z825 Family history of asthma and other chronic lower respiratory diseases: Secondary | ICD-10-CM | POA: Diagnosis not present

## 2018-02-12 DIAGNOSIS — H4312 Vitreous hemorrhage, left eye: Secondary | ICD-10-CM | POA: Diagnosis present

## 2018-02-12 HISTORY — DX: Family history of other specified conditions: Z84.89

## 2018-02-12 HISTORY — PX: PARS PLANA VITRECTOMY 27 GAUGE: SHX6738

## 2018-02-12 HISTORY — DX: Personal history of other medical treatment: Z92.89

## 2018-02-12 LAB — CBC
HEMATOCRIT: 36.5 % (ref 36.0–46.0)
Hemoglobin: 12.7 g/dL (ref 12.0–15.0)
MCH: 30.7 pg (ref 26.0–34.0)
MCHC: 34.8 g/dL (ref 30.0–36.0)
MCV: 88.2 fL (ref 78.0–100.0)
PLATELETS: 135 10*3/uL — AB (ref 150–400)
RBC: 4.14 MIL/uL (ref 3.87–5.11)
RDW: 13.4 % (ref 11.5–15.5)
WBC: 6.2 10*3/uL (ref 4.0–10.5)

## 2018-02-12 LAB — GLUCOSE, CAPILLARY
GLUCOSE-CAPILLARY: 161 mg/dL — AB (ref 70–99)
GLUCOSE-CAPILLARY: 367 mg/dL — AB (ref 70–99)
Glucose-Capillary: 202 mg/dL — ABNORMAL HIGH (ref 70–99)
Glucose-Capillary: 211 mg/dL — ABNORMAL HIGH (ref 70–99)
Glucose-Capillary: 170 mg/dL — ABNORMAL HIGH (ref 70–99)

## 2018-02-12 LAB — BASIC METABOLIC PANEL
Anion gap: 11 (ref 5–15)
BUN: 16 mg/dL (ref 8–23)
CALCIUM: 9 mg/dL (ref 8.9–10.3)
CO2: 20 mmol/L — ABNORMAL LOW (ref 22–32)
CREATININE: 1.68 mg/dL — AB (ref 0.44–1.00)
Chloride: 106 mmol/L (ref 98–111)
GFR calc Af Amer: 36 mL/min — ABNORMAL LOW (ref >60)
GFR, EST NON AFRICAN AMERICAN: 31 mL/min — AB (ref >60)
GLUCOSE: 210 mg/dL — AB (ref 70–99)
POTASSIUM: 4.7 mmol/L (ref 3.5–5.1)
Sodium: 137 mmol/L (ref 135–145)

## 2018-02-12 LAB — HEMOGLOBIN A1C
HEMOGLOBIN A1C: 8.7 % — AB (ref 4.8–5.6)
MEAN PLASMA GLUCOSE: 202.99 mg/dL

## 2018-02-12 SURGERY — PARS PLANA VITRECTOMY 27 GAUGE
Anesthesia: General | Site: Eye | Laterality: Left

## 2018-02-12 MED ORDER — PHENYLEPHRINE HCL 2.5 % OP SOLN
1.0000 [drp] | OPHTHALMIC | Status: AC
  Administered 2018-02-12 (×3): 1 [drp] via OPHTHALMIC
  Filled 2018-02-12: qty 2

## 2018-02-12 MED ORDER — PROPOFOL 10 MG/ML IV BOLUS
INTRAVENOUS | Status: AC
  Filled 2018-02-12: qty 20

## 2018-02-12 MED ORDER — ONDANSETRON HCL 4 MG/2ML IJ SOLN: 4.0000 mg | Freq: Once | INTRAMUSCULAR | Status: DC | PRN

## 2018-02-12 MED ORDER — ATROPINE SULFATE 1 % OP SOLN
OPHTHALMIC | Status: AC
  Filled 2018-02-12: qty 5

## 2018-02-12 MED ORDER — FENTANYL CITRATE (PF) 100 MCG/2ML IJ SOLN: 25.0000 ug | INTRAMUSCULAR | Status: DC | PRN

## 2018-02-12 MED ORDER — BRIMONIDINE TARTRATE 0.2 % OP SOLN
1.0000 [drp] | Freq: Two times a day (BID) | OPHTHALMIC | Status: DC
  Filled 2018-02-12: qty 5

## 2018-02-12 MED ORDER — BSS IO SOLN
INTRAOCULAR | Status: AC
  Filled 2018-02-12: qty 15

## 2018-02-12 MED ORDER — POLYMYXIN B SULFATE 500000 UNITS IJ SOLR
INTRAMUSCULAR | Status: AC
  Filled 2018-02-12: qty 500000

## 2018-02-12 MED ORDER — FENTANYL CITRATE (PF) 250 MCG/5ML IJ SOLN
INTRAMUSCULAR | Status: AC
  Filled 2018-02-12: qty 5

## 2018-02-12 MED ORDER — SODIUM CHLORIDE 0.9 % IJ SOLN
INTRAMUSCULAR | Status: AC
  Filled 2018-02-12: qty 10

## 2018-02-12 MED ORDER — BSS PLUS IO SOLN
INTRAOCULAR | Status: AC
  Filled 2018-02-12: qty 500

## 2018-02-12 MED ORDER — EPINEPHRINE PF 1 MG/ML IJ SOLN
INTRAMUSCULAR | Status: AC
  Filled 2018-02-12: qty 1

## 2018-02-12 MED ORDER — SUGAMMADEX SODIUM 200 MG/2ML IV SOLN
INTRAVENOUS | Status: DC | PRN
  Administered 2018-02-12: 200 mg via INTRAVENOUS

## 2018-02-12 MED ORDER — HYDROCODONE-ACETAMINOPHEN 5-325 MG PO TABS: 1.0000 | ORAL_TABLET | ORAL | Status: DC | PRN

## 2018-02-12 MED ORDER — ONDANSETRON HCL 4 MG/2ML IJ SOLN
INTRAMUSCULAR | Status: AC
  Filled 2018-02-12: qty 2

## 2018-02-12 MED ORDER — DEXAMETHASONE SODIUM PHOSPHATE 10 MG/ML IJ SOLN
INTRAMUSCULAR | Status: AC
  Filled 2018-02-12: qty 1

## 2018-02-12 MED ORDER — AMLODIPINE BESYLATE 5 MG PO TABS: 5.0000 mg | ORAL_TABLET | Freq: Every day | ORAL | Status: DC

## 2018-02-12 MED ORDER — CEFTAZIDIME 1 G IJ SOLR
INTRAMUSCULAR | Status: AC
  Filled 2018-02-12: qty 1

## 2018-02-12 MED ORDER — HYPROMELLOSE (GONIOSCOPIC) 2.5 % OP SOLN
OPHTHALMIC | Status: AC
  Filled 2018-02-12: qty 15

## 2018-02-12 MED ORDER — ATROPINE SULFATE 1 % OP SOLN
1.0000 [drp] | OPHTHALMIC | Status: AC
  Administered 2018-02-12: 1 [drp] via OPHTHALMIC
  Filled 2018-02-12: qty 5

## 2018-02-12 MED ORDER — HEMOSTATIC AGENTS (NO CHARGE) OPTIME
TOPICAL | Status: DC | PRN
  Administered 2018-02-12: 1

## 2018-02-12 MED ORDER — SODIUM CHLORIDE 0.9 % IV SOLN
INTRAVENOUS | Status: DC | PRN
  Administered 2018-02-12: 15:00:00 via INTRAVENOUS

## 2018-02-12 MED ORDER — BUPIVACAINE HCL (PF) 0.75 % IJ SOLN
INTRAMUSCULAR | Status: AC
  Filled 2018-02-12: qty 10

## 2018-02-12 MED ORDER — HYDRALAZINE HCL 20 MG/ML IJ SOLN
INTRAMUSCULAR | Status: AC
  Administered 2018-02-12: 17:00:00
  Filled 2018-02-12: qty 1

## 2018-02-12 MED ORDER — SODIUM HYALURONATE 10 MG/ML IO SOLN
INTRAOCULAR | Status: AC
  Filled 2018-02-12: qty 0.85

## 2018-02-12 MED ORDER — BUPIVACAINE-EPINEPHRINE (PF) 0.25% -1:200000 IJ SOLN
INTRAMUSCULAR | Status: AC
  Filled 2018-02-12: qty 30

## 2018-02-12 MED ORDER — EPINEPHRINE PF 1 MG/ML IJ SOLN
INTRAMUSCULAR | Status: DC | PRN
  Administered 2018-02-12: 1 mg

## 2018-02-12 MED ORDER — LIDOCAINE 2% (20 MG/ML) 5 ML SYRINGE
INTRAMUSCULAR | Status: AC
  Filled 2018-02-12: qty 5

## 2018-02-12 MED ORDER — MIDAZOLAM HCL 2 MG/2ML IJ SOLN
INTRAMUSCULAR | Status: AC
  Filled 2018-02-12: qty 2

## 2018-02-12 MED ORDER — FENTANYL CITRATE (PF) 250 MCG/5ML IJ SOLN
INTRAMUSCULAR | Status: DC | PRN
  Administered 2018-02-12: 50 ug via INTRAVENOUS
  Administered 2018-02-12: 100 ug via INTRAVENOUS

## 2018-02-12 MED ORDER — BEVACIZUMAB CHEMO INJECTION 1.25MG/0.05ML SYRINGE FOR KALEIDOSCOPE
1.2500 mg | INTRAVITREAL | Status: AC
  Administered 2018-02-12: 1.25 mg via INTRAVITREAL
  Filled 2018-02-12 (×3): qty 0.1

## 2018-02-12 MED ORDER — TEMAZEPAM 15 MG PO CAPS: 15.0000 mg | ORAL_CAPSULE | Freq: Every evening | ORAL | Status: DC | PRN

## 2018-02-12 MED ORDER — 0.9 % SODIUM CHLORIDE (POUR BTL) OPTIME
TOPICAL | Status: DC | PRN
  Administered 2018-02-12: 1000 mL

## 2018-02-12 MED ORDER — GATIFLOXACIN 0.5 % OP SOLN
1.0000 [drp] | OPHTHALMIC | Status: AC
  Administered 2018-02-12 (×3): 1 [drp] via OPHTHALMIC
  Filled 2018-02-12: qty 2.5

## 2018-02-12 MED ORDER — ROCURONIUM BROMIDE 50 MG/5ML IV SOSY
PREFILLED_SYRINGE | INTRAVENOUS | Status: AC
  Filled 2018-02-12: qty 5

## 2018-02-12 MED ORDER — BACITRACIN-POLYMYXIN B 500-10000 UNIT/GM OP OINT
TOPICAL_OINTMENT | OPHTHALMIC | Status: DC | PRN
  Administered 2018-02-12: 1 via OPHTHALMIC

## 2018-02-12 MED ORDER — MORPHINE SULFATE (PF) 2 MG/ML IV SOLN: 1.0000 mg | INTRAVENOUS | Status: DC | PRN

## 2018-02-12 MED ORDER — TROPICAMIDE 1 % OP SOLN
1.0000 [drp] | OPHTHALMIC | Status: AC
  Administered 2018-02-12 (×3): 1 [drp] via OPHTHALMIC
  Filled 2018-02-12: qty 15

## 2018-02-12 MED ORDER — DEXAMETHASONE SODIUM PHOSPHATE 10 MG/ML IJ SOLN
INTRAMUSCULAR | Status: DC | PRN
  Administered 2018-02-12: 10 mg

## 2018-02-12 MED ORDER — METOPROLOL TARTRATE 25 MG PO TABS
25.0000 mg | ORAL_TABLET | Freq: Two times a day (BID) | ORAL | Status: DC
  Administered 2018-02-12: 25 mg via ORAL
  Filled 2018-02-12: qty 1

## 2018-02-12 MED ORDER — ACETAZOLAMIDE SODIUM 500 MG IJ SOLR
INTRAMUSCULAR | Status: AC
  Filled 2018-02-12: qty 500

## 2018-02-12 MED ORDER — LIDOCAINE HCL 2 % IJ SOLN
INTRAMUSCULAR | Status: AC
  Filled 2018-02-12: qty 20

## 2018-02-12 MED ORDER — ACETAZOLAMIDE SODIUM 500 MG IJ SOLR
500.0000 mg | Freq: Once | INTRAMUSCULAR | Status: AC
  Administered 2018-02-13: 500 mg via INTRAVENOUS
  Filled 2018-02-12: qty 500

## 2018-02-12 MED ORDER — CEFAZOLIN SODIUM-DEXTROSE 2-4 GM/100ML-% IV SOLN
2.0000 g | INTRAVENOUS | Status: AC
  Administered 2018-02-12: 2 g via INTRAVENOUS
  Filled 2018-02-12: qty 100

## 2018-02-12 MED ORDER — TETRACAINE HCL 0.5 % OP SOLN
2.0000 [drp] | Freq: Once | OPHTHALMIC | Status: DC
  Filled 2018-02-12: qty 4

## 2018-02-12 MED ORDER — SODIUM HYALURONATE 10 MG/ML IO SOLN
INTRAOCULAR | Status: DC | PRN
  Administered 2018-02-12: 0.85 mL via INTRAOCULAR

## 2018-02-12 MED ORDER — ONDANSETRON HCL 4 MG/2ML IJ SOLN
INTRAMUSCULAR | Status: DC | PRN
  Administered 2018-02-12: 4 mg via INTRAVENOUS

## 2018-02-12 MED ORDER — LATANOPROST 0.005 % OP SOLN
1.0000 [drp] | Freq: Every day | OPHTHALMIC | Status: DC
  Filled 2018-02-12: qty 2.5

## 2018-02-12 MED ORDER — GATIFLOXACIN 0.5 % OP SOLN
1.0000 [drp] | Freq: Four times a day (QID) | OPHTHALMIC | Status: DC
  Filled 2018-02-12: qty 2.5

## 2018-02-12 MED ORDER — MAGNESIUM HYDROXIDE 400 MG/5ML PO SUSP: 15.0000 mL | Freq: Four times a day (QID) | ORAL | Status: DC | PRN

## 2018-02-12 MED ORDER — BSS PLUS IO SOLN
INTRAOCULAR | Status: DC | PRN
  Administered 2018-02-12: 1 via INTRAOCULAR

## 2018-02-12 MED ORDER — BACITRACIN-POLYMYXIN B 500-10000 UNIT/GM OP OINT
1.0000 "application " | TOPICAL_OINTMENT | Freq: Three times a day (TID) | OPHTHALMIC | Status: DC
  Filled 2018-02-12: qty 3.5

## 2018-02-12 MED ORDER — HYALURONIDASE HUMAN 150 UNIT/ML IJ SOLN
INTRAMUSCULAR | Status: AC
  Filled 2018-02-12: qty 1

## 2018-02-12 MED ORDER — ROCURONIUM BROMIDE 10 MG/ML (PF) SYRINGE
PREFILLED_SYRINGE | INTRAVENOUS | Status: DC | PRN
  Administered 2018-02-12: 50 mg via INTRAVENOUS

## 2018-02-12 MED ORDER — HYDRALAZINE HCL 20 MG/ML IJ SOLN
5.0000 mg | Freq: Once | INTRAMUSCULAR | Status: AC
  Administered 2018-02-12: 5 mg via INTRAVENOUS

## 2018-02-12 MED ORDER — STERILE WATER FOR INJECTION IJ SOLN
INTRAMUSCULAR | Status: AC
  Filled 2018-02-12: qty 20

## 2018-02-12 MED ORDER — PREDNISOLONE ACETATE 1 % OP SUSP
1.0000 [drp] | Freq: Four times a day (QID) | OPHTHALMIC | Status: DC
  Filled 2018-02-12: qty 5

## 2018-02-12 MED ORDER — LIDOCAINE 2% (20 MG/ML) 5 ML SYRINGE
INTRAMUSCULAR | Status: DC | PRN
  Administered 2018-02-12 (×2): 50 mg via INTRAVENOUS

## 2018-02-12 MED ORDER — SODIUM CHLORIDE 0.45 % IV SOLN
INTRAVENOUS | Status: DC
  Administered 2018-02-12: 18:00:00 via INTRAVENOUS

## 2018-02-12 MED ORDER — BUPIVACAINE HCL (PF) 0.75 % IJ SOLN
INTRAMUSCULAR | Status: DC | PRN
  Administered 2018-02-12: 10 mL

## 2018-02-12 MED ORDER — BSS IO SOLN
INTRAOCULAR | Status: DC | PRN
  Administered 2018-02-12: 500 mL via INTRAOCULAR

## 2018-02-12 MED ORDER — STERILE WATER FOR INJECTION IJ SOLN
INTRAMUSCULAR | Status: DC | PRN
  Administered 2018-02-12: 20 mL

## 2018-02-12 MED ORDER — INSULIN ASPART 100 UNIT/ML ~~LOC~~ SOLN
0.0000 [IU] | SUBCUTANEOUS | Status: DC
  Administered 2018-02-12: 3 [IU] via SUBCUTANEOUS
  Administered 2018-02-12: 15 [IU] via SUBCUTANEOUS
  Administered 2018-02-13: 8 [IU] via SUBCUTANEOUS
  Administered 2018-02-13: 11 [IU] via SUBCUTANEOUS

## 2018-02-12 MED ORDER — HYDRALAZINE HCL 25 MG PO TABS
25.0000 mg | ORAL_TABLET | Freq: Two times a day (BID) | ORAL | Status: DC
  Administered 2018-02-12: 25 mg via ORAL
  Filled 2018-02-12: qty 1

## 2018-02-12 MED ORDER — CYCLOPENTOLATE HCL 1 % OP SOLN
1.0000 [drp] | OPHTHALMIC | Status: AC
  Administered 2018-02-12 (×3): 1 [drp] via OPHTHALMIC
  Filled 2018-02-12: qty 2

## 2018-02-12 MED ORDER — HYDRALAZINE HCL 20 MG/ML IJ SOLN
INTRAMUSCULAR | Status: AC
  Filled 2018-02-12: qty 1

## 2018-02-12 MED ORDER — INSULIN DETEMIR 100 UNIT/ML ~~LOC~~ SOLN
25.0000 [IU] | Freq: Every evening | SUBCUTANEOUS | Status: DC
  Administered 2018-02-12: 25 [IU] via SUBCUTANEOUS
  Filled 2018-02-12: qty 0.25

## 2018-02-12 MED ORDER — BACITRACIN-POLYMYXIN B 500-10000 UNIT/GM OP OINT
TOPICAL_OINTMENT | OPHTHALMIC | Status: AC
  Filled 2018-02-12: qty 3.5

## 2018-02-12 MED ORDER — GLIPIZIDE 5 MG PO TABS: 10.0000 mg | ORAL_TABLET | Freq: Every day | ORAL | Status: DC

## 2018-02-12 MED ORDER — MIDAZOLAM HCL 5 MG/5ML IJ SOLN
INTRAMUSCULAR | Status: DC | PRN
  Administered 2018-02-12: 2 mg via INTRAVENOUS

## 2018-02-12 MED ORDER — ACETAMINOPHEN 325 MG PO TABS: 325.0000 mg | ORAL_TABLET | ORAL | Status: DC | PRN

## 2018-02-12 MED ORDER — ONDANSETRON HCL 4 MG/2ML IJ SOLN
4.0000 mg | Freq: Four times a day (QID) | INTRAMUSCULAR | Status: DC
  Administered 2018-02-12 – 2018-02-13 (×3): 4 mg via INTRAVENOUS
  Filled 2018-02-12 (×3): qty 2

## 2018-02-12 MED ORDER — TRIAMCINOLONE ACETONIDE 40 MG/ML IJ SUSP
INTRAMUSCULAR | Status: AC
  Filled 2018-02-12: qty 5

## 2018-02-12 MED ORDER — DORZOLAMIDE HCL 2 % OP SOLN
1.0000 [drp] | Freq: Three times a day (TID) | OPHTHALMIC | Status: DC
  Filled 2018-02-12: qty 10

## 2018-02-12 MED ORDER — METOPROLOL TARTRATE 12.5 MG HALF TABLET
ORAL_TABLET | ORAL | Status: AC
  Administered 2018-02-12: 25 mg via ORAL
  Filled 2018-02-12: qty 2

## 2018-02-12 MED ORDER — METOPROLOL TARTRATE 12.5 MG HALF TABLET
25.0000 mg | ORAL_TABLET | Freq: Once | ORAL | Status: AC
  Administered 2018-02-12: 25 mg via ORAL

## 2018-02-12 SURGICAL SUPPLY — 78 items
APL SRG 3 HI ABS STRL LF PLS (MISCELLANEOUS)
APPLICATOR DR MATTHEWS STRL (MISCELLANEOUS) IMPLANT
BALL CTTN LRG ABS STRL LF (GAUZE/BANDAGES/DRESSINGS) ×3
BLADE EYE CATARACT 19 1.4 BEAV (BLADE) IMPLANT
BLADE MVR KNIFE 19G (BLADE) IMPLANT
BLADE MVR KNIFE 20G (BLADE) IMPLANT
CABLE BIPOLOR RESECTION CORD (MISCELLANEOUS) IMPLANT
CANNULA ANTERIOR CHAMBER 27GA (MISCELLANEOUS) IMPLANT
CANNULA DUAL BORE 23G (CANNULA) IMPLANT
CANNULA TROCAR 25G 6 VLV (OPHTHALMIC) IMPLANT
CANNULA TROCAR 25GA VLV (OPHTHALMIC) IMPLANT
CANNULA VLV SOFT TIP 27G (OPHTHALMIC) ×1 IMPLANT
CANNULA VLV SOFT TIP 27GA (OPHTHALMIC) ×2 IMPLANT
COTTONBALL LRG STERILE PKG (GAUZE/BANDAGES/DRESSINGS) ×6 IMPLANT
COVER MAYO STAND STRL (DRAPES) IMPLANT
DRAPE INCISE 51X51 W/FILM STRL (DRAPES) IMPLANT
DRAPE OPHTHALMIC 77X100 STRL (CUSTOM PROCEDURE TRAY) ×2 IMPLANT
FILTER BLUE MILLIPORE (MISCELLANEOUS) IMPLANT
FILTER STRAW FLUID ASPIR (MISCELLANEOUS) IMPLANT
FORCEPS ECKARDT ILM 25G SERR (OPHTHALMIC RELATED) IMPLANT
FORCEPS GRIESHABER ILM 27G (INSTRUMENTS) ×2 IMPLANT
GLOVE SS BIOGEL STRL SZ 6.5 (GLOVE) ×1 IMPLANT
GLOVE SS BIOGEL STRL SZ 7 (GLOVE) ×1 IMPLANT
GLOVE SUPERSENSE BIOGEL SZ 6.5 (GLOVE) ×1
GLOVE SUPERSENSE BIOGEL SZ 7 (GLOVE) ×1
GLOVE SURG 8.5 LATEX PF (GLOVE) ×2 IMPLANT
GOWN STRL REUS W/ TWL LRG LVL3 (GOWN DISPOSABLE) ×3 IMPLANT
GOWN STRL REUS W/TWL LRG LVL3 (GOWN DISPOSABLE) ×6
HANDLE PNEUMATIC FOR CONSTEL (OPHTHALMIC) IMPLANT
KIT BASIN OR (CUSTOM PROCEDURE TRAY) ×2 IMPLANT
KNIFE CRESCENT 2.5 55 ANG (BLADE) IMPLANT
MICROPICK 25G (MISCELLANEOUS)
NDL 18GX1X1/2 (RX/OR ONLY) (NEEDLE) ×1 IMPLANT
NDL 25GX 5/8IN NON SAFETY (NEEDLE) IMPLANT
NDL FILTER BLUNT 18X1 1/2 (NEEDLE) ×1 IMPLANT
NDL HYPO 30X.5 LL (NEEDLE) IMPLANT
NDL PRECISIONGLIDE 27X1.5 (NEEDLE) ×1 IMPLANT
NEEDLE 18GX1X1/2 (RX/OR ONLY) (NEEDLE) ×2 IMPLANT
NEEDLE 25GX 5/8IN NON SAFETY (NEEDLE) IMPLANT
NEEDLE FILTER BLUNT 18X 1/2SAF (NEEDLE) ×1
NEEDLE FILTER BLUNT 18X1 1/2 (NEEDLE) ×1 IMPLANT
NEEDLE HYPO 30X.5 LL (NEEDLE) IMPLANT
NEEDLE PRECISIONGLIDE 27X1.5 (NEEDLE) ×2 IMPLANT
NS IRRIG 1000ML POUR BTL (IV SOLUTION) ×2 IMPLANT
PACK VITRECTOMY CUSTOM (CUSTOM PROCEDURE TRAY) ×2 IMPLANT
PAD ARMBOARD 7.5X6 YLW CONV (MISCELLANEOUS) ×4 IMPLANT
PAK VITRECTOMY PIK  27GA (OPHTHALMIC) ×1
PAK VITRECTOMY PIK 27GA (OPHTHALMIC) ×1 IMPLANT
PENCIL BIPOLAR 25GA STR DISP (OPHTHALMIC RELATED) IMPLANT
PIC ILLUMINATED 25G (OPHTHALMIC) ×2
PICK MICROPICK 25G (MISCELLANEOUS) IMPLANT
PIK ILLUMINATED 25G (OPHTHALMIC) ×1 IMPLANT
PROBE DIATHERMY DSP 27GA (MISCELLANEOUS) ×2 IMPLANT
PROBE LASER ILLUM FLEX 27GA (OPHTHALMIC) ×2 IMPLANT
PROBE LASER ILLUM FLEX CVD 25G (OPHTHALMIC) IMPLANT
REPL STRA BRUSH NDL (NEEDLE) IMPLANT
REPL STRA BRUSH NEEDLE (NEEDLE) IMPLANT
RESERVOIR BACK FLUSH (MISCELLANEOUS) IMPLANT
ROLLS DENTAL (MISCELLANEOUS) ×4 IMPLANT
SCISSORS TIP ADVANCED DSP 25GA (INSTRUMENTS) IMPLANT
SCRAPER DIAMOND 25GA (OPHTHALMIC RELATED) IMPLANT
SCRAPER DIAMOND DUST MEMBRANE (MISCELLANEOUS) IMPLANT
SPONGE SURGIFOAM ABS GEL 12-7 (HEMOSTASIS) ×2 IMPLANT
STOPCOCK 4 WAY LG BORE MALE ST (IV SETS) IMPLANT
SUT CHROMIC 7 0 TG140 8 (SUTURE) IMPLANT
SUT ETHILON 10 0 CS140 6 (SUTURE) IMPLANT
SUT ETHILON 9 0 TG140 8 (SUTURE) IMPLANT
SUT POLY NON ABSORB 10-0 8 STR (SUTURE) IMPLANT
SUT SILK 4 0 RB 1 (SUTURE) IMPLANT
SYR 10ML LL (SYRINGE) IMPLANT
SYR 20CC LL (SYRINGE) ×2 IMPLANT
SYR 5ML LL (SYRINGE) IMPLANT
SYR BULB 3OZ (MISCELLANEOUS) ×2 IMPLANT
SYR TB 1ML LUER SLIP (SYRINGE) ×2 IMPLANT
TOWEL NATURAL 6PK STERILE (DISPOSABLE) ×2 IMPLANT
TUBING HIGH PRESS EXTEN 6IN (TUBING) IMPLANT
WATER STERILE IRR 1000ML POUR (IV SOLUTION) ×2 IMPLANT
WIPE INSTRUMENT VISIWIPE 73X73 (MISCELLANEOUS) IMPLANT

## 2018-02-12 NOTE — Brief Op Note (Signed)
Brief Operative note   Preoperative diagnosis:  vitreous hemorrhage left eye proliferative diabetic retinopathy Postoperative diagnosis  * No Diagnosis Codes entered *  Procedures: Pars plana vitrectomy, laser, gas injection left eye  Surgeon:  Hayden Pedro, MD...  Assistant:  Deatra Ina SA   Anesthesia: General  Specimen: none  Estimated blood loss:  1cc  Complications: none  Patient sent to PACU in good condition  Composed by Hayden Pedro MD  Dictation number: [phone]

## 2018-02-12 NOTE — Anesthesia Preprocedure Evaluation (Addendum)
Anesthesia Evaluation  Patient identified by MRN, date of birth, ID band Patient awake    Reviewed: Allergy & Precautions, NPO status , Patient's Chart, lab work & pertinent test results, reviewed documented beta blocker date and time   Airway Mallampati: III  TM Distance: >3 FB Neck ROM: Full    Dental  (+) Chipped,    Pulmonary neg pulmonary ROS,    Pulmonary exam normal breath sounds clear to auscultation       Cardiovascular hypertension, Pt. on medications and Pt. on home beta blockers Normal cardiovascular exam Rhythm:Regular Rate:Normal  ECG: NSR, rate 68   Neuro/Psych negative neurological ROS  negative psych ROS   GI/Hepatic negative GI ROS, Neg liver ROS,   Endo/Other  diabetes, Insulin Dependent, Oral Hypoglycemic Agents  Renal/GU Renal disease     Musculoskeletal negative musculoskeletal ROS (+)   Abdominal (+) + obese,   Peds  Hematology HLD   Anesthesia Other Findings vitreous hemorrhage left eye proliferative diabetic retinopathy  Reproductive/Obstetrics                           Anesthesia Physical Anesthesia Plan  ASA: III  Anesthesia Plan: General   Post-op Pain Management:    Induction: Intravenous  PONV Risk Score and Plan: 3 and Ondansetron, Midazolam and Treatment may vary due to age or medical condition  Airway Management Planned: Oral ETT  Additional Equipment:   Intra-op Plan:   Post-operative Plan: Extubation in OR  Informed Consent: I have reviewed the patients History and Physical, chart, labs and discussed the procedure including the risks, benefits and alternatives for the proposed anesthesia with the patient or authorized representative who has indicated his/her understanding and acceptance.   Dental advisory given  Plan Discussed with: CRNA  Anesthesia Plan Comments:        Anesthesia Quick Evaluation

## 2018-02-12 NOTE — Op Note (Signed)
NAME: Sarah Bridges, Sarah Bridges MEDICAL RECORD [id number] ACCOUNT [id number] DATE OF BIRTH:[birth year] FACILITY: [facility] LOCATION: [place], [place]  OPERATIVE REPORT  DATE OF PROCEDURE:  02/12/2018  ADMISSION DIAGNOSES: 1.  Vitreous hemorrhage, left eye. 2.  Proliferative diabetic retinopathy, left eye. 3.  Hypertensive retinopathy, left eye.  SURGEON:  Tempie Hoist, MD  PROCEDURES:  Pars plana vitrectomy, left eye, with 27-gauge system and retinal photocoagulation and gas fluid exchange, all in the left eye.  SURGEON:  Tempie Hoist, MD  ASSISTANT:  Deatra Ina  ANESTHESIA:  General.  DESCRIPTION OF PROCEDURE:  Usual prep and drape and 27-gauge trocars were placed at 10, 2, and 4 o'clock.  Provisc placed on the corneal surface.  Pars plana vitrectomy was begun just behind the pseudophacos.  Red blood was seen in the vitreous cavity in  multiple clumps.  These areas were attacked with the 27-gauge cutter, and the blood was carefully and slowly removed from the vitreous cavity.  There was high cutting and low suction.  The vitrectomy was carried posteriorly where additional vitreous  blood was encountered and removed.  There was blood vacuumed from the surface of the retina.  The vitrectomy was carried into the mid periphery where additional blood was seen on the surface of the retina and in the vitreous cavity.  The attention was  carried into the far periphery with the super-wide viewing lens on the BIOM viewing system.  Scleral depression was used to gain access to the vitreous base, and blood was removed from the vitreous cavity and down to the vitreous base at 5, 6, 7, 8  o'clock.  Rotation of the eye allowed visualization of the peripheral retina, and blood was removed from these areas as well.  The endolaser was positioned in the eye, 557 burns were placed around the retinal periphery.  The power was 320 milliwatts,  1000 microns each and 0.1 seconds each.  The  cutter was shifted from left hand to right hand and back to remove fine all vitreous opacities and remove them.  Once all that were removed, a 30% gas fluid exchange was carried out.  The instruments were  removed from the eye.  The trocars were removed from the eye as well.  The wounds were tested and found to be secure.  Avastin 1.2 mg was injected into the 12 o'clock pars plana into the remaining vitreous fluid.  Polymyxin and ceftazidime were rinsed  around the globe for antibiotic coverage.  Decadron 10 mg was injected into the lower subconjunctival space.  Marcaine was injected around the globe for postop pain.  The closing pressure was 10 with a Barraquer tonometer.  Polysporin ophthalmic  ointment, a patch and a shield were placed.  The patient was awakened and taken to recovery in satisfactory condition.  LN/NUANCE  D:02/12/2018 T:02/12/2018 JOB:[id number]

## 2018-02-12 NOTE — Anesthesia Procedure Notes (Signed)
Procedure Name: Intubation Date/Time: 02/12/2018 3:16 PM Performed by: Shirlyn Goltz, CRNA Pre-anesthesia Checklist: Patient identified, Emergency Drugs available, Suction available and Patient being monitored Patient Re-evaluated:Patient Re-evaluated prior to induction Oxygen Delivery Method: Circle system utilized Preoxygenation: Pre-oxygenation with 100% oxygen Induction Type: IV induction Ventilation: Mask ventilation without difficulty and Oral airway inserted - appropriate to patient size Laryngoscope Size: Mac and 3 Grade View: Grade II Tube type: Oral Tube size: 7.0 mm Number of attempts: 1 Airway Equipment and Method: Stylet and LTA kit utilized Placement Confirmation: ETT inserted through vocal cords under direct vision,  positive ETCO2 and breath sounds checked- equal and bilateral Secured at: 21 cm Tube secured with: Tape Dental Injury: Teeth and Oropharynx as per pre-operative assessment

## 2018-02-12 NOTE — H&P (Signed)
I examined the patient today and there is no change in the medical status 

## 2018-02-12 NOTE — Transfer of Care (Signed)
Immediate Anesthesia Transfer of Care Note  Patient: Sarah Bridges  Procedure(s) Performed: PARS PLANA VITRECTOMY 46 GAUGE (Left )  Patient Location: PACU  Anesthesia Type:General  Level of Consciousness: awake, alert , oriented and patient cooperative  Airway & Oxygen Therapy: Patient Spontanous Breathing and Patient connected to nasal cannula oxygen  Post-op Assessment: Report given to RN and Post -op Vital signs reviewed and stable  Post vital signs: Reviewed and stable  Last Vitals:  Vitals Value Taken Time  BP 157/80 02/12/2018  4:01 PM  Temp    Pulse 64 02/12/2018  4:03 PM  Resp 10 02/12/2018  4:03 PM  SpO2 93 % 02/12/2018  4:03 PM  Vitals shown include unvalidated device data.  Last Pain:  Vitals:   02/12/18 0922  PainSc: 0-No pain      Patients Stated Pain Goal: 3 (74/71/59 5396)  Complications: No apparent anesthesia complications

## 2018-02-13 ENCOUNTER — Encounter (HOSPITAL_COMMUNITY): Payer: Self-pay | Admitting: Ophthalmology

## 2018-02-13 DIAGNOSIS — E669 Obesity, unspecified: Secondary | ICD-10-CM | POA: Diagnosis not present

## 2018-02-13 DIAGNOSIS — E559 Vitamin D deficiency, unspecified: Secondary | ICD-10-CM | POA: Diagnosis not present

## 2018-02-13 DIAGNOSIS — E785 Hyperlipidemia, unspecified: Secondary | ICD-10-CM | POA: Diagnosis not present

## 2018-02-13 DIAGNOSIS — Z9842 Cataract extraction status, left eye: Secondary | ICD-10-CM | POA: Diagnosis not present

## 2018-02-13 DIAGNOSIS — Z89411 Acquired absence of right great toe: Secondary | ICD-10-CM | POA: Diagnosis not present

## 2018-02-13 DIAGNOSIS — Z89421 Acquired absence of other right toe(s): Secondary | ICD-10-CM | POA: Diagnosis not present

## 2018-02-13 DIAGNOSIS — H4312 Vitreous hemorrhage, left eye: Secondary | ICD-10-CM | POA: Diagnosis not present

## 2018-02-13 DIAGNOSIS — Z6834 Body mass index (BMI) 34.0-34.9, adult: Secondary | ICD-10-CM | POA: Diagnosis not present

## 2018-02-13 DIAGNOSIS — E113592 Type 2 diabetes mellitus with proliferative diabetic retinopathy without macular edema, left eye: Secondary | ICD-10-CM | POA: Diagnosis not present

## 2018-02-13 DIAGNOSIS — H35032 Hypertensive retinopathy, left eye: Secondary | ICD-10-CM | POA: Diagnosis not present

## 2018-02-13 LAB — GLUCOSE, CAPILLARY
GLUCOSE-CAPILLARY: 283 mg/dL — AB (ref 70–99)
GLUCOSE-CAPILLARY: 335 mg/dL — AB (ref 70–99)
Glucose-Capillary: 267 mg/dL — ABNORMAL HIGH (ref 70–99)

## 2018-02-13 MED ORDER — GATIFLOXACIN 0.5 % OP SOLN: 1.0000 [drp] | Freq: Four times a day (QID) | OPHTHALMIC | Status: DC

## 2018-02-13 MED ORDER — BACITRACIN-POLYMYXIN B 500-10000 UNIT/GM OP OINT: 1.0000 "application " | TOPICAL_OINTMENT | Freq: Three times a day (TID) | OPHTHALMIC | 0 refills | Status: DC

## 2018-02-13 MED ORDER — PREDNISOLONE ACETATE 1 % OP SUSP: 1.0000 [drp] | Freq: Four times a day (QID) | OPHTHALMIC | 0 refills | Status: DC

## 2018-02-13 NOTE — Discharge Summary (Signed)
Discharge summary not needed on [facility] patients per medical records. 

## 2018-02-13 NOTE — Anesthesia Postprocedure Evaluation (Signed)
Anesthesia Post Note  Patient: Sarah Bridges  Procedure(s) Performed: PARS PLANA VITRECTOMY 27 GAUGE ENDOLASER GAS/FLUID EXCHANGE AVASTIN INJECTION (Left Eye)     Patient location during evaluation: PACU Anesthesia Type: General Level of consciousness: awake and alert Pain management: pain level controlled Vital Signs Assessment: post-procedure vital signs reviewed and stable Respiratory status: spontaneous breathing, nonlabored ventilation, respiratory function stable and patient connected to nasal cannula oxygen Cardiovascular status: blood pressure returned to baseline and stable Postop Assessment: no apparent nausea or vomiting Anesthetic complications: no    Last Vitals:  Vitals:   02/13/18 0235 02/13/18 0501  BP: (!) 162/75 (!) 163/32  Pulse: 68 71  Resp: 18 17  Temp: 37 C 36.8 C  SpO2: 98% 99%    Last Pain:  Vitals:   02/13/18 0501  TempSrc: Oral  PainSc:                  Sarah Bridges DAVID

## 2018-02-13 NOTE — Progress Notes (Signed)
02/13/2018, 6:16 AM  Mental Status:  Awake, Alert, Oriented  Anterior segment: Cornea  Clear    Anterior Chamber Clear    Lens:   Clear, IOL,  Intra Ocular Pressure 18 mmHg with Tonopen  Vitreous: Clear 30%gas bubble   Retina:  Attached Good laser reaction Hemorrhage  Hazy view  Impression: Excellent result Retina attached   Final Diagnosis: Principal Problem:   Vitreous hemorrhage, left (HCC) Active Problems:   Vitreous hemorrhage, left eye (HCC)   Plan: start post operative eye drops.  Discharge to home.  Give post operative instructions  Sarah Bridges 02/13/2018, 6:16 AM

## 2018-02-19 ENCOUNTER — Encounter (INDEPENDENT_AMBULATORY_CARE_PROVIDER_SITE_OTHER): Payer: Medicare HMO | Admitting: Ophthalmology

## 2018-02-19 DIAGNOSIS — E11319 Type 2 diabetes mellitus with unspecified diabetic retinopathy without macular edema: Secondary | ICD-10-CM

## 2018-02-19 DIAGNOSIS — E113592 Type 2 diabetes mellitus with proliferative diabetic retinopathy without macular edema, left eye: Secondary | ICD-10-CM

## 2018-02-21 DIAGNOSIS — E109 Type 1 diabetes mellitus without complications: Secondary | ICD-10-CM | POA: Diagnosis not present

## 2018-02-21 DIAGNOSIS — E782 Mixed hyperlipidemia: Secondary | ICD-10-CM | POA: Diagnosis not present

## 2018-02-21 DIAGNOSIS — I10 Essential (primary) hypertension: Secondary | ICD-10-CM | POA: Diagnosis not present

## 2018-02-21 DIAGNOSIS — D508 Other iron deficiency anemias: Secondary | ICD-10-CM | POA: Diagnosis not present

## 2018-02-21 DIAGNOSIS — S98111D Complete traumatic amputation of right great toe, subsequent encounter: Secondary | ICD-10-CM | POA: Diagnosis not present

## 2018-02-21 DIAGNOSIS — E559 Vitamin D deficiency, unspecified: Secondary | ICD-10-CM | POA: Diagnosis not present

## 2018-03-06 ENCOUNTER — Encounter (INDEPENDENT_AMBULATORY_CARE_PROVIDER_SITE_OTHER): Payer: Medicare HMO | Admitting: Ophthalmology

## 2018-03-06 DIAGNOSIS — D3131 Benign neoplasm of right choroid: Secondary | ICD-10-CM | POA: Diagnosis not present

## 2018-03-06 DIAGNOSIS — E113512 Type 2 diabetes mellitus with proliferative diabetic retinopathy with macular edema, left eye: Secondary | ICD-10-CM | POA: Diagnosis not present

## 2018-03-06 DIAGNOSIS — E113311 Type 2 diabetes mellitus with moderate nonproliferative diabetic retinopathy with macular edema, right eye: Secondary | ICD-10-CM

## 2018-03-06 DIAGNOSIS — H35033 Hypertensive retinopathy, bilateral: Secondary | ICD-10-CM | POA: Diagnosis not present

## 2018-03-06 DIAGNOSIS — E11311 Type 2 diabetes mellitus with unspecified diabetic retinopathy with macular edema: Secondary | ICD-10-CM | POA: Diagnosis not present

## 2018-03-06 DIAGNOSIS — I10 Essential (primary) hypertension: Secondary | ICD-10-CM

## 2018-03-06 DIAGNOSIS — H43811 Vitreous degeneration, right eye: Secondary | ICD-10-CM

## 2018-04-24 ENCOUNTER — Encounter (INDEPENDENT_AMBULATORY_CARE_PROVIDER_SITE_OTHER): Payer: Medicare HMO | Admitting: Ophthalmology

## 2018-04-25 ENCOUNTER — Encounter (INDEPENDENT_AMBULATORY_CARE_PROVIDER_SITE_OTHER): Payer: Medicare HMO | Admitting: Ophthalmology

## 2018-04-25 DIAGNOSIS — E11311 Type 2 diabetes mellitus with unspecified diabetic retinopathy with macular edema: Secondary | ICD-10-CM

## 2018-04-25 DIAGNOSIS — H35033 Hypertensive retinopathy, bilateral: Secondary | ICD-10-CM | POA: Diagnosis not present

## 2018-04-25 DIAGNOSIS — E113512 Type 2 diabetes mellitus with proliferative diabetic retinopathy with macular edema, left eye: Secondary | ICD-10-CM | POA: Diagnosis not present

## 2018-04-25 DIAGNOSIS — H43811 Vitreous degeneration, right eye: Secondary | ICD-10-CM | POA: Diagnosis not present

## 2018-04-25 DIAGNOSIS — D3131 Benign neoplasm of right choroid: Secondary | ICD-10-CM | POA: Diagnosis not present

## 2018-04-25 DIAGNOSIS — I10 Essential (primary) hypertension: Secondary | ICD-10-CM

## 2018-04-25 DIAGNOSIS — E113311 Type 2 diabetes mellitus with moderate nonproliferative diabetic retinopathy with macular edema, right eye: Secondary | ICD-10-CM | POA: Diagnosis not present

## 2018-06-20 ENCOUNTER — Encounter (INDEPENDENT_AMBULATORY_CARE_PROVIDER_SITE_OTHER): Payer: Medicare HMO | Admitting: Ophthalmology

## 2018-06-20 DIAGNOSIS — I10 Essential (primary) hypertension: Secondary | ICD-10-CM | POA: Diagnosis not present

## 2018-06-20 DIAGNOSIS — E113592 Type 2 diabetes mellitus with proliferative diabetic retinopathy without macular edema, left eye: Secondary | ICD-10-CM

## 2018-06-20 DIAGNOSIS — H43811 Vitreous degeneration, right eye: Secondary | ICD-10-CM

## 2018-06-20 DIAGNOSIS — E11311 Type 2 diabetes mellitus with unspecified diabetic retinopathy with macular edema: Secondary | ICD-10-CM | POA: Diagnosis not present

## 2018-06-20 DIAGNOSIS — E113311 Type 2 diabetes mellitus with moderate nonproliferative diabetic retinopathy with macular edema, right eye: Secondary | ICD-10-CM | POA: Diagnosis not present

## 2018-06-20 DIAGNOSIS — H35033 Hypertensive retinopathy, bilateral: Secondary | ICD-10-CM

## 2018-06-21 ENCOUNTER — Encounter (INDEPENDENT_AMBULATORY_CARE_PROVIDER_SITE_OTHER): Payer: Medicare HMO | Admitting: Ophthalmology

## 2018-07-02 DIAGNOSIS — I10 Essential (primary) hypertension: Secondary | ICD-10-CM | POA: Diagnosis not present

## 2018-07-02 DIAGNOSIS — E559 Vitamin D deficiency, unspecified: Secondary | ICD-10-CM | POA: Diagnosis not present

## 2018-07-02 DIAGNOSIS — E109 Type 1 diabetes mellitus without complications: Secondary | ICD-10-CM | POA: Diagnosis not present

## 2018-07-19 DIAGNOSIS — Z961 Presence of intraocular lens: Secondary | ICD-10-CM | POA: Diagnosis not present

## 2018-07-19 DIAGNOSIS — Z794 Long term (current) use of insulin: Secondary | ICD-10-CM | POA: Diagnosis not present

## 2018-07-19 DIAGNOSIS — Z7984 Long term (current) use of oral hypoglycemic drugs: Secondary | ICD-10-CM | POA: Diagnosis not present

## 2018-07-19 DIAGNOSIS — E113513 Type 2 diabetes mellitus with proliferative diabetic retinopathy with macular edema, bilateral: Secondary | ICD-10-CM | POA: Diagnosis not present

## 2018-07-29 DIAGNOSIS — E109 Type 1 diabetes mellitus without complications: Secondary | ICD-10-CM | POA: Diagnosis not present

## 2018-07-29 DIAGNOSIS — E559 Vitamin D deficiency, unspecified: Secondary | ICD-10-CM | POA: Diagnosis not present

## 2018-07-29 DIAGNOSIS — D508 Other iron deficiency anemias: Secondary | ICD-10-CM | POA: Diagnosis not present

## 2018-07-29 DIAGNOSIS — E782 Mixed hyperlipidemia: Secondary | ICD-10-CM | POA: Diagnosis not present

## 2018-07-29 DIAGNOSIS — I10 Essential (primary) hypertension: Secondary | ICD-10-CM | POA: Diagnosis not present

## 2018-08-07 DIAGNOSIS — I10 Essential (primary) hypertension: Secondary | ICD-10-CM | POA: Diagnosis not present

## 2018-08-07 DIAGNOSIS — E559 Vitamin D deficiency, unspecified: Secondary | ICD-10-CM | POA: Diagnosis not present

## 2018-08-07 DIAGNOSIS — E782 Mixed hyperlipidemia: Secondary | ICD-10-CM | POA: Diagnosis not present

## 2018-08-07 DIAGNOSIS — E109 Type 1 diabetes mellitus without complications: Secondary | ICD-10-CM | POA: Diagnosis not present

## 2018-08-16 ENCOUNTER — Other Ambulatory Visit: Payer: Self-pay

## 2018-08-16 ENCOUNTER — Encounter (INDEPENDENT_AMBULATORY_CARE_PROVIDER_SITE_OTHER): Payer: Medicare HMO | Admitting: Ophthalmology

## 2018-08-16 DIAGNOSIS — E113592 Type 2 diabetes mellitus with proliferative diabetic retinopathy without macular edema, left eye: Secondary | ICD-10-CM

## 2018-08-16 DIAGNOSIS — I10 Essential (primary) hypertension: Secondary | ICD-10-CM

## 2018-08-16 DIAGNOSIS — D3131 Benign neoplasm of right choroid: Secondary | ICD-10-CM | POA: Diagnosis not present

## 2018-08-16 DIAGNOSIS — E113311 Type 2 diabetes mellitus with moderate nonproliferative diabetic retinopathy with macular edema, right eye: Secondary | ICD-10-CM | POA: Diagnosis not present

## 2018-08-16 DIAGNOSIS — E11311 Type 2 diabetes mellitus with unspecified diabetic retinopathy with macular edema: Secondary | ICD-10-CM | POA: Diagnosis not present

## 2018-08-16 DIAGNOSIS — H43811 Vitreous degeneration, right eye: Secondary | ICD-10-CM | POA: Diagnosis not present

## 2018-08-16 DIAGNOSIS — H35033 Hypertensive retinopathy, bilateral: Secondary | ICD-10-CM | POA: Diagnosis not present

## 2018-10-09 ENCOUNTER — Other Ambulatory Visit: Payer: Self-pay

## 2018-10-09 ENCOUNTER — Encounter (INDEPENDENT_AMBULATORY_CARE_PROVIDER_SITE_OTHER): Payer: Medicare HMO | Admitting: Ophthalmology

## 2018-10-09 DIAGNOSIS — E11311 Type 2 diabetes mellitus with unspecified diabetic retinopathy with macular edema: Secondary | ICD-10-CM

## 2018-10-09 DIAGNOSIS — I10 Essential (primary) hypertension: Secondary | ICD-10-CM | POA: Diagnosis not present

## 2018-10-09 DIAGNOSIS — H35033 Hypertensive retinopathy, bilateral: Secondary | ICD-10-CM

## 2018-10-09 DIAGNOSIS — D3131 Benign neoplasm of right choroid: Secondary | ICD-10-CM | POA: Diagnosis not present

## 2018-10-09 DIAGNOSIS — H43811 Vitreous degeneration, right eye: Secondary | ICD-10-CM

## 2018-10-09 DIAGNOSIS — E113311 Type 2 diabetes mellitus with moderate nonproliferative diabetic retinopathy with macular edema, right eye: Secondary | ICD-10-CM

## 2018-10-09 DIAGNOSIS — E113592 Type 2 diabetes mellitus with proliferative diabetic retinopathy without macular edema, left eye: Secondary | ICD-10-CM

## 2018-11-07 DIAGNOSIS — E559 Vitamin D deficiency, unspecified: Secondary | ICD-10-CM | POA: Diagnosis not present

## 2018-11-07 DIAGNOSIS — I10 Essential (primary) hypertension: Secondary | ICD-10-CM | POA: Diagnosis not present

## 2018-11-07 DIAGNOSIS — E782 Mixed hyperlipidemia: Secondary | ICD-10-CM | POA: Diagnosis not present

## 2018-11-07 DIAGNOSIS — E109 Type 1 diabetes mellitus without complications: Secondary | ICD-10-CM | POA: Diagnosis not present

## 2018-11-07 DIAGNOSIS — G4700 Insomnia, unspecified: Secondary | ICD-10-CM | POA: Diagnosis not present

## 2018-11-13 DIAGNOSIS — R0600 Dyspnea, unspecified: Secondary | ICD-10-CM | POA: Diagnosis not present

## 2018-11-20 DIAGNOSIS — E109 Type 1 diabetes mellitus without complications: Secondary | ICD-10-CM | POA: Diagnosis not present

## 2018-11-20 DIAGNOSIS — E782 Mixed hyperlipidemia: Secondary | ICD-10-CM | POA: Diagnosis not present

## 2018-12-04 ENCOUNTER — Other Ambulatory Visit: Payer: Self-pay

## 2018-12-04 ENCOUNTER — Encounter (INDEPENDENT_AMBULATORY_CARE_PROVIDER_SITE_OTHER): Payer: Medicare HMO | Admitting: Ophthalmology

## 2018-12-04 DIAGNOSIS — E11311 Type 2 diabetes mellitus with unspecified diabetic retinopathy with macular edema: Secondary | ICD-10-CM | POA: Diagnosis not present

## 2018-12-04 DIAGNOSIS — H43813 Vitreous degeneration, bilateral: Secondary | ICD-10-CM

## 2018-12-04 DIAGNOSIS — E113592 Type 2 diabetes mellitus with proliferative diabetic retinopathy without macular edema, left eye: Secondary | ICD-10-CM

## 2018-12-04 DIAGNOSIS — I10 Essential (primary) hypertension: Secondary | ICD-10-CM

## 2018-12-04 DIAGNOSIS — D3131 Benign neoplasm of right choroid: Secondary | ICD-10-CM | POA: Diagnosis not present

## 2018-12-04 DIAGNOSIS — H35033 Hypertensive retinopathy, bilateral: Secondary | ICD-10-CM | POA: Diagnosis not present

## 2018-12-04 DIAGNOSIS — E113311 Type 2 diabetes mellitus with moderate nonproliferative diabetic retinopathy with macular edema, right eye: Secondary | ICD-10-CM | POA: Diagnosis not present

## 2019-01-29 ENCOUNTER — Other Ambulatory Visit: Payer: Self-pay

## 2019-01-29 ENCOUNTER — Encounter (INDEPENDENT_AMBULATORY_CARE_PROVIDER_SITE_OTHER): Payer: Medicare HMO | Admitting: Ophthalmology

## 2019-01-29 DIAGNOSIS — H35033 Hypertensive retinopathy, bilateral: Secondary | ICD-10-CM | POA: Diagnosis not present

## 2019-01-29 DIAGNOSIS — E11311 Type 2 diabetes mellitus with unspecified diabetic retinopathy with macular edema: Secondary | ICD-10-CM

## 2019-01-29 DIAGNOSIS — I10 Essential (primary) hypertension: Secondary | ICD-10-CM

## 2019-01-29 DIAGNOSIS — D3131 Benign neoplasm of right choroid: Secondary | ICD-10-CM

## 2019-01-29 DIAGNOSIS — E113311 Type 2 diabetes mellitus with moderate nonproliferative diabetic retinopathy with macular edema, right eye: Secondary | ICD-10-CM

## 2019-01-29 DIAGNOSIS — H43813 Vitreous degeneration, bilateral: Secondary | ICD-10-CM

## 2019-01-29 DIAGNOSIS — E113592 Type 2 diabetes mellitus with proliferative diabetic retinopathy without macular edema, left eye: Secondary | ICD-10-CM | POA: Diagnosis not present

## 2019-02-20 DIAGNOSIS — I10 Essential (primary) hypertension: Secondary | ICD-10-CM | POA: Diagnosis not present

## 2019-02-20 DIAGNOSIS — G4700 Insomnia, unspecified: Secondary | ICD-10-CM | POA: Diagnosis not present

## 2019-02-20 DIAGNOSIS — E109 Type 1 diabetes mellitus without complications: Secondary | ICD-10-CM | POA: Diagnosis not present

## 2019-02-20 DIAGNOSIS — E782 Mixed hyperlipidemia: Secondary | ICD-10-CM | POA: Diagnosis not present

## 2019-04-02 ENCOUNTER — Encounter (INDEPENDENT_AMBULATORY_CARE_PROVIDER_SITE_OTHER): Payer: Medicare HMO | Admitting: Ophthalmology

## 2019-04-02 DIAGNOSIS — E113311 Type 2 diabetes mellitus with moderate nonproliferative diabetic retinopathy with macular edema, right eye: Secondary | ICD-10-CM

## 2019-04-02 DIAGNOSIS — I10 Essential (primary) hypertension: Secondary | ICD-10-CM

## 2019-04-02 DIAGNOSIS — H35033 Hypertensive retinopathy, bilateral: Secondary | ICD-10-CM

## 2019-04-02 DIAGNOSIS — H43813 Vitreous degeneration, bilateral: Secondary | ICD-10-CM

## 2019-04-02 DIAGNOSIS — E11311 Type 2 diabetes mellitus with unspecified diabetic retinopathy with macular edema: Secondary | ICD-10-CM

## 2019-04-02 DIAGNOSIS — E113592 Type 2 diabetes mellitus with proliferative diabetic retinopathy without macular edema, left eye: Secondary | ICD-10-CM

## 2019-04-02 DIAGNOSIS — D3131 Benign neoplasm of right choroid: Secondary | ICD-10-CM | POA: Diagnosis not present

## 2019-05-22 DIAGNOSIS — N184 Chronic kidney disease, stage 4 (severe): Secondary | ICD-10-CM | POA: Diagnosis not present

## 2019-05-22 DIAGNOSIS — E109 Type 1 diabetes mellitus without complications: Secondary | ICD-10-CM | POA: Diagnosis not present

## 2019-05-22 DIAGNOSIS — E782 Mixed hyperlipidemia: Secondary | ICD-10-CM | POA: Diagnosis not present

## 2019-05-22 DIAGNOSIS — I10 Essential (primary) hypertension: Secondary | ICD-10-CM | POA: Diagnosis not present

## 2019-05-22 DIAGNOSIS — G4700 Insomnia, unspecified: Secondary | ICD-10-CM | POA: Diagnosis not present

## 2019-05-28 ENCOUNTER — Encounter (INDEPENDENT_AMBULATORY_CARE_PROVIDER_SITE_OTHER): Payer: Medicare HMO | Admitting: Ophthalmology

## 2019-05-28 DIAGNOSIS — D3131 Benign neoplasm of right choroid: Secondary | ICD-10-CM

## 2019-05-28 DIAGNOSIS — E113592 Type 2 diabetes mellitus with proliferative diabetic retinopathy without macular edema, left eye: Secondary | ICD-10-CM | POA: Diagnosis not present

## 2019-05-28 DIAGNOSIS — I10 Essential (primary) hypertension: Secondary | ICD-10-CM | POA: Diagnosis not present

## 2019-05-28 DIAGNOSIS — E113311 Type 2 diabetes mellitus with moderate nonproliferative diabetic retinopathy with macular edema, right eye: Secondary | ICD-10-CM | POA: Diagnosis not present

## 2019-05-28 DIAGNOSIS — H35033 Hypertensive retinopathy, bilateral: Secondary | ICD-10-CM

## 2019-05-28 DIAGNOSIS — E11311 Type 2 diabetes mellitus with unspecified diabetic retinopathy with macular edema: Secondary | ICD-10-CM

## 2019-05-28 DIAGNOSIS — H43813 Vitreous degeneration, bilateral: Secondary | ICD-10-CM

## 2019-06-26 ENCOUNTER — Other Ambulatory Visit (HOSPITAL_COMMUNITY): Payer: Self-pay | Admitting: Nephrology

## 2019-06-26 DIAGNOSIS — I129 Hypertensive chronic kidney disease with stage 1 through stage 4 chronic kidney disease, or unspecified chronic kidney disease: Secondary | ICD-10-CM | POA: Diagnosis not present

## 2019-06-26 DIAGNOSIS — E559 Vitamin D deficiency, unspecified: Secondary | ICD-10-CM | POA: Diagnosis not present

## 2019-06-26 DIAGNOSIS — E1129 Type 2 diabetes mellitus with other diabetic kidney complication: Secondary | ICD-10-CM | POA: Diagnosis not present

## 2019-06-26 DIAGNOSIS — D638 Anemia in other chronic diseases classified elsewhere: Secondary | ICD-10-CM | POA: Diagnosis not present

## 2019-06-26 DIAGNOSIS — N189 Chronic kidney disease, unspecified: Secondary | ICD-10-CM | POA: Diagnosis not present

## 2019-06-26 DIAGNOSIS — R809 Proteinuria, unspecified: Secondary | ICD-10-CM | POA: Diagnosis not present

## 2019-06-26 DIAGNOSIS — E1122 Type 2 diabetes mellitus with diabetic chronic kidney disease: Secondary | ICD-10-CM | POA: Diagnosis not present

## 2019-07-03 ENCOUNTER — Other Ambulatory Visit: Payer: Self-pay

## 2019-07-03 ENCOUNTER — Ambulatory Visit (HOSPITAL_COMMUNITY)
Admission: RE | Admit: 2019-07-03 | Discharge: 2019-07-03 | Disposition: A | Discharge status: Home / Self Care | Payer: Medicare HMO | Source: Ambulatory Visit | Attending: Nephrology | Admitting: Nephrology

## 2019-07-03 DIAGNOSIS — R809 Proteinuria, unspecified: Secondary | ICD-10-CM | POA: Diagnosis not present

## 2019-07-03 DIAGNOSIS — E1122 Type 2 diabetes mellitus with diabetic chronic kidney disease: Secondary | ICD-10-CM | POA: Diagnosis not present

## 2019-07-03 DIAGNOSIS — N189 Chronic kidney disease, unspecified: Secondary | ICD-10-CM

## 2019-07-03 DIAGNOSIS — Z79899 Other long term (current) drug therapy: Secondary | ICD-10-CM | POA: Diagnosis not present

## 2019-07-03 DIAGNOSIS — E559 Vitamin D deficiency, unspecified: Secondary | ICD-10-CM | POA: Diagnosis not present

## 2019-07-03 DIAGNOSIS — I129 Hypertensive chronic kidney disease with stage 1 through stage 4 chronic kidney disease, or unspecified chronic kidney disease: Secondary | ICD-10-CM | POA: Diagnosis not present

## 2019-07-03 DIAGNOSIS — D638 Anemia in other chronic diseases classified elsewhere: Secondary | ICD-10-CM | POA: Diagnosis not present

## 2019-07-03 DIAGNOSIS — E1129 Type 2 diabetes mellitus with other diabetic kidney complication: Secondary | ICD-10-CM | POA: Diagnosis not present

## 2019-07-23 ENCOUNTER — Encounter (INDEPENDENT_AMBULATORY_CARE_PROVIDER_SITE_OTHER): Payer: Medicare HMO | Admitting: Ophthalmology

## 2019-07-23 DIAGNOSIS — H43813 Vitreous degeneration, bilateral: Secondary | ICD-10-CM

## 2019-07-23 DIAGNOSIS — E113311 Type 2 diabetes mellitus with moderate nonproliferative diabetic retinopathy with macular edema, right eye: Secondary | ICD-10-CM

## 2019-07-23 DIAGNOSIS — D3131 Benign neoplasm of right choroid: Secondary | ICD-10-CM | POA: Diagnosis not present

## 2019-07-23 DIAGNOSIS — H35033 Hypertensive retinopathy, bilateral: Secondary | ICD-10-CM

## 2019-07-23 DIAGNOSIS — I10 Essential (primary) hypertension: Secondary | ICD-10-CM

## 2019-07-23 DIAGNOSIS — E113592 Type 2 diabetes mellitus with proliferative diabetic retinopathy without macular edema, left eye: Secondary | ICD-10-CM | POA: Diagnosis not present

## 2019-07-23 DIAGNOSIS — E11311 Type 2 diabetes mellitus with unspecified diabetic retinopathy with macular edema: Secondary | ICD-10-CM

## 2019-08-20 DIAGNOSIS — G4700 Insomnia, unspecified: Secondary | ICD-10-CM | POA: Diagnosis not present

## 2019-08-20 DIAGNOSIS — E109 Type 1 diabetes mellitus without complications: Secondary | ICD-10-CM | POA: Diagnosis not present

## 2019-08-20 DIAGNOSIS — N184 Chronic kidney disease, stage 4 (severe): Secondary | ICD-10-CM | POA: Diagnosis not present

## 2019-08-20 DIAGNOSIS — I10 Essential (primary) hypertension: Secondary | ICD-10-CM | POA: Diagnosis not present

## 2019-08-20 DIAGNOSIS — E782 Mixed hyperlipidemia: Secondary | ICD-10-CM | POA: Diagnosis not present

## 2019-09-03 DIAGNOSIS — Z79899 Other long term (current) drug therapy: Secondary | ICD-10-CM | POA: Diagnosis not present

## 2019-09-03 DIAGNOSIS — I129 Hypertensive chronic kidney disease with stage 1 through stage 4 chronic kidney disease, or unspecified chronic kidney disease: Secondary | ICD-10-CM | POA: Diagnosis not present

## 2019-09-03 DIAGNOSIS — R809 Proteinuria, unspecified: Secondary | ICD-10-CM | POA: Diagnosis not present

## 2019-09-03 DIAGNOSIS — D472 Monoclonal gammopathy: Secondary | ICD-10-CM | POA: Diagnosis not present

## 2019-09-03 DIAGNOSIS — E1122 Type 2 diabetes mellitus with diabetic chronic kidney disease: Secondary | ICD-10-CM | POA: Diagnosis not present

## 2019-09-03 DIAGNOSIS — N189 Chronic kidney disease, unspecified: Secondary | ICD-10-CM | POA: Diagnosis not present

## 2019-09-03 DIAGNOSIS — E1129 Type 2 diabetes mellitus with other diabetic kidney complication: Secondary | ICD-10-CM | POA: Diagnosis not present

## 2019-09-17 DIAGNOSIS — N189 Chronic kidney disease, unspecified: Secondary | ICD-10-CM | POA: Diagnosis not present

## 2019-09-17 DIAGNOSIS — Z79899 Other long term (current) drug therapy: Secondary | ICD-10-CM | POA: Diagnosis not present

## 2019-09-17 DIAGNOSIS — E1129 Type 2 diabetes mellitus with other diabetic kidney complication: Secondary | ICD-10-CM | POA: Diagnosis not present

## 2019-09-17 DIAGNOSIS — R809 Proteinuria, unspecified: Secondary | ICD-10-CM | POA: Diagnosis not present

## 2019-09-17 DIAGNOSIS — I129 Hypertensive chronic kidney disease with stage 1 through stage 4 chronic kidney disease, or unspecified chronic kidney disease: Secondary | ICD-10-CM | POA: Diagnosis not present

## 2019-09-17 DIAGNOSIS — E1122 Type 2 diabetes mellitus with diabetic chronic kidney disease: Secondary | ICD-10-CM | POA: Diagnosis not present

## 2019-09-17 DIAGNOSIS — D472 Monoclonal gammopathy: Secondary | ICD-10-CM | POA: Diagnosis not present

## 2019-09-18 ENCOUNTER — Encounter (INDEPENDENT_AMBULATORY_CARE_PROVIDER_SITE_OTHER): Payer: Medicare HMO | Admitting: Ophthalmology

## 2019-09-18 DIAGNOSIS — H43813 Vitreous degeneration, bilateral: Secondary | ICD-10-CM

## 2019-09-18 DIAGNOSIS — E11311 Type 2 diabetes mellitus with unspecified diabetic retinopathy with macular edema: Secondary | ICD-10-CM

## 2019-09-18 DIAGNOSIS — E113512 Type 2 diabetes mellitus with proliferative diabetic retinopathy with macular edema, left eye: Secondary | ICD-10-CM | POA: Diagnosis not present

## 2019-09-18 DIAGNOSIS — E113311 Type 2 diabetes mellitus with moderate nonproliferative diabetic retinopathy with macular edema, right eye: Secondary | ICD-10-CM

## 2019-09-18 DIAGNOSIS — I10 Essential (primary) hypertension: Secondary | ICD-10-CM

## 2019-09-18 DIAGNOSIS — D3131 Benign neoplasm of right choroid: Secondary | ICD-10-CM | POA: Diagnosis not present

## 2019-09-18 DIAGNOSIS — H35033 Hypertensive retinopathy, bilateral: Secondary | ICD-10-CM | POA: Diagnosis not present

## 2019-09-23 ENCOUNTER — Encounter (HOSPITAL_COMMUNITY): Payer: Self-pay | Admitting: Hematology

## 2019-09-23 ENCOUNTER — Other Ambulatory Visit: Payer: Self-pay

## 2019-09-23 ENCOUNTER — Inpatient Hospital Stay (HOSPITAL_COMMUNITY): Payer: Medicare HMO | Attending: Hematology | Admitting: Hematology

## 2019-09-23 ENCOUNTER — Inpatient Hospital Stay (HOSPITAL_COMMUNITY): Payer: Medicare HMO

## 2019-09-23 DIAGNOSIS — D472 Monoclonal gammopathy: Secondary | ICD-10-CM

## 2019-09-23 DIAGNOSIS — Z801 Family history of malignant neoplasm of trachea, bronchus and lung: Secondary | ICD-10-CM

## 2019-09-23 DIAGNOSIS — I10 Essential (primary) hypertension: Secondary | ICD-10-CM

## 2019-09-23 DIAGNOSIS — Z89411 Acquired absence of right great toe: Secondary | ICD-10-CM

## 2019-09-23 DIAGNOSIS — E785 Hyperlipidemia, unspecified: Secondary | ICD-10-CM | POA: Diagnosis not present

## 2019-09-23 DIAGNOSIS — N184 Chronic kidney disease, stage 4 (severe): Secondary | ICD-10-CM | POA: Diagnosis not present

## 2019-09-23 DIAGNOSIS — E119 Type 2 diabetes mellitus without complications: Secondary | ICD-10-CM

## 2019-09-23 DIAGNOSIS — Z79899 Other long term (current) drug therapy: Secondary | ICD-10-CM | POA: Diagnosis not present

## 2019-09-23 DIAGNOSIS — Z794 Long term (current) use of insulin: Secondary | ICD-10-CM | POA: Insufficient documentation

## 2019-09-23 LAB — CBC WITH DIFFERENTIAL/PLATELET
Abs Immature Granulocytes: 0.03 10*3/uL (ref 0.00–0.07)
Basophils Absolute: 0.1 10*3/uL (ref 0.0–0.1)
Basophils Relative: 1 %
Eosinophils Absolute: 0.2 10*3/uL (ref 0.0–0.5)
Eosinophils Relative: 2 %
HCT: 34.8 % — ABNORMAL LOW (ref 36.0–46.0)
Hemoglobin: 11.8 g/dL — ABNORMAL LOW (ref 12.0–15.0)
Immature Granulocytes: 0 %
Lymphocytes Relative: 19 %
Lymphs Abs: 1.3 10*3/uL (ref 0.7–4.0)
MCH: 30.6 pg (ref 26.0–34.0)
MCHC: 33.9 g/dL (ref 30.0–36.0)
MCV: 90.2 fL (ref 80.0–100.0)
Monocytes Absolute: 0.4 10*3/uL (ref 0.1–1.0)
Monocytes Relative: 5 %
Neutro Abs: 5.1 10*3/uL (ref 1.7–7.7)
Neutrophils Relative %: 73 %
Platelets: 123 10*3/uL — ABNORMAL LOW (ref 150–400)
RBC: 3.86 MIL/uL — ABNORMAL LOW (ref 3.87–5.11)
RDW: 14 % (ref 11.5–15.5)
WBC: 7 10*3/uL (ref 4.0–10.5)
nRBC: 0 % (ref 0.0–0.2)

## 2019-09-23 LAB — COMPREHENSIVE METABOLIC PANEL
ALT: 14 U/L (ref 0–44)
AST: 18 U/L (ref 15–41)
Albumin: 3.9 g/dL (ref 3.5–5.0)
Alkaline Phosphatase: 72 U/L (ref 38–126)
Anion gap: 9 (ref 5–15)
BUN: 24 mg/dL — ABNORMAL HIGH (ref 8–23)
CO2: 21 mmol/L — ABNORMAL LOW (ref 22–32)
Calcium: 9 mg/dL (ref 8.9–10.3)
Chloride: 107 mmol/L (ref 98–111)
Creatinine, Ser: 1.86 mg/dL — ABNORMAL HIGH (ref 0.44–1.00)
GFR calc Af Amer: 32 mL/min — ABNORMAL LOW (ref >60)
GFR calc non Af Amer: 28 mL/min — ABNORMAL LOW (ref >60)
Glucose, Bld: 224 mg/dL — ABNORMAL HIGH (ref 70–99)
Potassium: 4.7 mmol/L (ref 3.5–5.1)
Sodium: 137 mmol/L (ref 135–145)
Total Bilirubin: 0.7 mg/dL (ref 0.3–1.2)
Total Protein: 7.6 g/dL (ref 6.5–8.1)

## 2019-09-23 LAB — LACTATE DEHYDROGENASE: LDH: 158 U/L (ref 98–192)

## 2019-09-23 NOTE — Patient Instructions (Addendum)
[place] at [facility] Discharge Instructions  You were seen today by Dr. Delton Coombes. He went over your history, family history and how you've been feeling lately. You will have blood drawn today before you leave the hospital. He will see you back in 3 weeks for follow up.   Thank you for choosing [place] at [facility] to provide your oncology and hematology care.  To afford each patient quality time with our provider, please arrive at least 15 minutes before your scheduled appointment time.   If you have a lab appointment with the [place] please come in thru the  Main Entrance and check in at the main information desk  You need to re-schedule your appointment should you arrive 10 or more minutes late.  We strive to give you quality time with our providers, and arriving late affects you and other patients whose appointments are after yours.  Also, if you no show three or more times for appointments you may be dismissed from the clinic at the providers discretion.     Again, thank you for choosing [facility].  Our hope is that these requests will decrease the amount of time that you wait before being seen by our physicians.       _____________________________________________________________  Should you have questions after your visit to [facility], please contact our office at [phone] between the hours of 8:00 a.m. and 4:30 p.m.  Voicemails left after 4:00 p.m. will not be returned until the following business day.  For prescription refill requests, have your pharmacy contact our office and allow 72 hours.    Cancer Center Support Programs:   > Cancer Support Group  2nd Tuesday of the month 1pm-2pm, Journey Room

## 2019-09-23 NOTE — Assessment & Plan Note (Addendum)
1.  Monoclonal gammopathy: -Patient seen at the request of Dr. Theador Hawthorne for further work-up of monoclonal gammopathy. 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 -SPEP showed 2 poorly defined bands of restricted protein mobility 1 in the gammaglobulin and 1 in the beta globulin. -Free kappa light chains are 90.5 and lambda light chain 64.2 with ratio of 1.42.  Hemoglobin on 07/03/2019 was 12.7 with platelet count of 123.  Calcium was 9.3. -Today we will repeat SPEP and check immunofixation along with free light chains.  We will also check LDH and beta-2 microglobulin. -She already had 24-hour urine studies with Dr. Theador Hawthorne.  Based on the blood work, we will determine if she needs skeletal survey.  She will be seen back in 3 weeks for follow-up.  2.  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 -She also has nonnephrotic range proteinuria. -Renal ultrasound on 07/03/2019 showed bilateral renal cortical atrophy without renal mass or hydronephrosis.  3.  Diabetes: -She is on Glucotrol 10 mg daily and Soliqua. -Last hemoglobin A1c on 07/03/2019 was 6.6.  4.  Hypertension: -She will continue amlodipine 5 mg daily and losartan 25 mg daily. -Blood pressure today is 161/67.

## 2019-09-23 NOTE — Progress Notes (Signed)
[facility] NOTE  Patient Care Team: Adaline Sill, NP as PCP - General (Internal Medicine) Gala Romney Cristopher Estimable, MD (Gastroenterology)  CHIEF COMPLAINTS/PURPOSE OF CONSULTATION:  Monoclonal gammopathy.  HISTORY OF PRESENTING ILLNESS:  Sarah Bridges 69 y.o. female is seen in consultation today at the request of Dr. Theador Hawthorne for further work-up and management of monoclonal gammopathy.  She was evaluated for chronic kidney disease.  Labs on 07/03/2019 showed a hemoglobin 12.7, white count of 6.6, platelet count 123, calcium 9.3.  Creatinine was 1.86.  CKD was thought to be secondary to diabetes.  She also has nonnephrotic range proteinuria.  Work-up for CKD showed SPEP with 2 poorly defined bands of restricted protein mobility with 1 band in the gamma region and a second band in the beta globulins.  Immunofixation was recommended.  Urine immunofixation showed a faint IgA kappa monoclonal immunoglobulin.  She denies any fevers, night sweats or weight loss in the last 6 months.  Denies any new onset bone pains.  No personal history of malignancies.  Reports numbness in the feet of several years duration.  She worked as a Optometrist prior to retirement.  Was never a smoker.  Denies any exposure to chemicals or pesticides.  Family history significant for maternal grandfather with lung cancer was a smoker.  Maternal grandmother had oral cancer.  She was a non-smoker.  No family history of plasma cell myeloma.  Appetite and energy levels are 100%.  Denies any headaches or vision changes.  MEDICAL HISTORY:  Past Medical History:  Diagnosis Date  . Avulsion fracture    right foot  . DM (diabetes mellitus) ([place])    Type II  . Family history of adverse reaction to anesthesia    mother - N/V  . History of blood transfusion   . Hyperlipidemia   . Hypertension   . Peripheral neuropathy   . Vitamin D deficiency   . Wears glasses     SURGICAL HISTORY: Past Surgical  History:  Procedure Laterality Date  . AMPUTATION TOE Right 07/17/2016   Procedure: AMPUTATION TOE right great and second toe;  Surgeon: Aviva Signs, MD;  Location: [facility];  Service: General;  Laterality: Right;  great and second toe, right  . CATARACT EXTRACTION W/PHACO Left 12/08/2016   Procedure: CATARACT EXTRACTION PHACO AND INTRAOCULAR LENS PLACEMENT (IOC);  Surgeon: Baruch Goldmann, MD;  Location: [facility];  Service: Ophthalmology;  Laterality: Left;  CDE: 7.51  . CATARACT EXTRACTION W/PHACO Right 12/22/2016   Procedure: CATARACT EXTRACTION PHACO AND INTRAOCULAR LENS PLACEMENT (IOC);  Surgeon: Baruch Goldmann, MD;  Location: [facility];  Service: Ophthalmology;  Laterality: Right;  CDE: 4.76  . COLONOSCOPY  01/17/2012   Procedure: COLONOSCOPY;  Surgeon: Daneil Dolin, MD;  Location: [facility];  Service: Endoscopy;  Laterality: N/A;  8:30  . HARDWARE REMOVAL Right 08/21/2014   Procedure: Irrigation and Debridement Right Foot, Removal Fixation and Place Antibiotic Beads;  Surgeon: Newt Minion, MD;  Location: [place];  Service: Orthopedics;  Laterality: Right;  . OPEN REDUCTION INTERNAL FIXATION (ORIF) FOOT LISFRANC FRACTURE Right 07/31/2014   Procedure: OPEN REDUCTION INTERNAL FIXATION (ORIF) FOOT NAVICULAR FRACTURE;  Surgeon: Newt Minion, MD;  Location: [place];  Service: Orthopedics;  Laterality: Right;  . PARS PLANA VITRECTOMY 27 GAUGE Left 02/12/2018   Procedure: PARS PLANA VITRECTOMY 27 GAUGE ENDOLASER GAS/FLUID EXCHANGE AVASTIN INJECTION;  Surgeon: Hayden Pedro, MD;  Location: [place];  Service: Ophthalmology;  Laterality: Left;  .  piloninal cyst     age 14  . TONSILLECTOMY    . TUBAL LIGATION  1976    SOCIAL HISTORY: Social History   Socioeconomic History  . Marital status: Married    Spouse name: Not on file  . Number of children: 2  . Years of education: Not on file  . Highest education level: Not on file  Occupational History  . Occupation: Control and instrumentation engineer    Comment: RETIRED   Tobacco Use  . Smoking status: Never Smoker  . Smokeless tobacco: Never Used  Substance and Sexual Activity  . Alcohol use: No  . Drug use: No  . Sexual activity: Not Currently  Other Topics Concern  . Not on file  Social History Narrative  . Not on file   Social Determinants of Health   Financial Resource Strain:   . Difficulty of Paying Living Expenses:   Food Insecurity:   . Worried About Charity fundraiser in the Last Year:   . Arboriculturist in the Last Year:   Transportation Needs:   . Film/video editor (Medical):   Marland Kitchen Lack of Transportation (Non-Medical):   Physical Activity:   . Days of Exercise per Week:   . Minutes of Exercise per Session:   Stress:   . Feeling of Stress :   Social Connections:   . Frequency of Communication with Friends and Family:   . Frequency of Social Gatherings with Friends and Family:   . Attends Religious Services:   . Active Member of Clubs or Organizations:   . Attends Archivist Meetings:   Marland Kitchen Marital Status:   Intimate Partner Violence:   . Fear of Current or Ex-Partner:   . Emotionally Abused:   Marland Kitchen Physically Abused:   . Sexually Abused:     FAMILY HISTORY: Family History  Problem Relation Age of Onset  . Emphysema Mother        deceased  . Heart murmur Brother   . Cancer - Other Maternal Grandmother   . Lung cancer Maternal Grandfather   . Liver disease Neg Hx   . Colon cancer Neg Hx     ALLERGIES:  has No Known Allergies.  MEDICATIONS:  Current Outpatient Medications  Medication Sig Dispense Refill  . amLODipine (NORVASC) 5 MG tablet Take 1 tablet (5 mg total) by mouth daily. 30 tablet 1  . Cholecalciferol (VITAMIN D) 2000 units tablet Take 2,000 Units by mouth every evening.     . ferrous sulfate 325 (65 FE) MG tablet Take 325 mg by mouth 2 (two) times daily.    Marland Kitchen glipiZIDE (GLUCOTROL) 10 MG tablet Take 10 mg by mouth daily.     . hydrALAZINE (APRESOLINE) 25 MG tablet Take 1 tablet (25 mg total)  by mouth every 8 (eight) hours. (Patient taking differently: Take 25 mg by mouth 2 (two) times daily. ) 90 tablet 0  . Insulin Glargine-Lixisenatide (SOLIQUA) 100-33 UNT-MCG/ML SOPN Inject into the skin.    Marland Kitchen losartan (COZAAR) 25 MG tablet Take 25 mg by mouth daily.    . simvastatin (ZOCOR) 20 MG tablet SMARTSIG:1 Tablet(s) By Mouth Every Evening     No current facility-administered medications for this visit.    REVIEW OF SYSTEMS:   Constitutional: Denies fevers, chills or abnormal night sweats Eyes: Denies blurriness of vision, double vision or watery eyes Ears, nose, mouth, throat, and face: Denies mucositis or sore throat Respiratory: Denies cough, dyspnea or wheezes Cardiovascular: Denies palpitation, chest discomfort  or lower extremity swelling Gastrointestinal:  Denies nausea, heartburn or change in bowel habits Skin: Denies abnormal skin rashes Lymphatics: Denies new lymphadenopathy or easy bruising Neurological: Numbness in the feet for many years. Behavioral/Psych: Mood is stable, no new changes  All other systems were reviewed with the patient and are negative.  PHYSICAL EXAMINATION: ECOG PERFORMANCE STATUS: 1 - Symptomatic but completely ambulatory  Vitals:   09/23/19 1308  BP: (!) 161/67  Pulse: 82  Resp: 19  Temp: (!) 97.3 F (36.3 C)  SpO2: 97%   Filed Weights   09/23/19 1308  Weight: 179 lb 3.2 oz (81.3 kg)    GENERAL:alert, no distress and comfortable SKIN: skin color, texture, turgor are normal, no rashes or significant lesions EYES: normal, conjunctiva are pink and non-injected, sclera clear OROPHARYNX:no exudate, no erythema and lips, buccal mucosa, and tongue normal  NECK: supple, thyroid normal size, non-tender, without nodularity LYMPH:  no palpable lymphadenopathy in the cervical, axillary or inguinal LUNGS: clear to auscultation and percussion with normal breathing effort HEART: regular rate & rhythm and no murmurs and no lower extremity  edema ABDOMEN:abdomen soft, non-tender and normal bowel sounds Musculoskeletal:no cyanosis of digits and no clubbing  PSYCH: alert & oriented x 3 with fluent speech NEURO: no focal motor/sensory deficits  LABORATORY DATA:  I have reviewed the data as listed Lab Results  Component Value Date   WBC 6.2 02/12/2018   HGB 12.7 02/12/2018   HCT 36.5 02/12/2018   MCV 88.2 02/12/2018   PLT 135 (L) 02/12/2018     Chemistry      Component Value Date/Time   NA 137 02/12/2018 0928   K 4.7 02/12/2018 0928   CL 106 02/12/2018 0928   CO2 20 (L) 02/12/2018 0928   BUN 16 02/12/2018 0928   CREATININE 1.68 (H) 02/12/2018 0928      Component Value Date/Time   CALCIUM 9.0 02/12/2018 0928   ALKPHOS 57 07/14/2016 0409   AST 13 (L) 07/14/2016 0409   ALT 11 (L) 07/14/2016 0409   BILITOT 0.4 07/14/2016 0409       RADIOGRAPHIC STUDIES: I have personally reviewed the radiological images as listed and agreed with the findings in the report.  ASSESSMENT & PLAN:  Monoclonal gammopathy 1.  Monoclonal gammopathy: -Patient seen at the request of Dr. Theador Hawthorne for further work-up of monoclonal gammopathy. 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 -SPEP showed 2 poorly defined bands of restricted protein mobility 1 in the gammaglobulin and 1 in the beta globulin. -Free kappa light chains are 90.5 and lambda light chain 64.2 with ratio of 1.42.  Hemoglobin on 07/03/2019 was 12.7 with platelet count of 123.  Calcium was 9.3. -Today we will repeat SPEP and check immunofixation along with free light chains.  We will also check LDH and beta-2 microglobulin. -She already had 24-hour urine studies with Dr. Theador Hawthorne.  Based on the blood work, we will determine if she needs skeletal survey.  She will be seen back in 3 weeks for follow-up.  2.  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 -She also has nonnephrotic  range proteinuria. -Renal ultrasound on 07/03/2019 showed bilateral renal cortical atrophy without renal mass or hydronephrosis.  3.  Diabetes: -She is on Glucotrol 10 mg daily and Soliqua. -Last hemoglobin A1c on 07/03/2019 was 6.6.  4.  Hypertension: -She will continue amlodipine 5 mg daily and losartan 25 mg daily. -Blood  pressure today is 161/67.  Orders Placed This Encounter  Procedures  . Protein electrophoresis, serum    Standing Status:   Future    Standing Expiration Date:   09/22/2020  . Kappa/lambda light chains    Standing Status:   Future    Standing Expiration Date:   09/22/2020  . Lactate dehydrogenase    Standing Status:   Future    Standing Expiration Date:   09/22/2020  . Immunofixation electrophoresis    Standing Status:   Future    Standing Expiration Date:   09/22/2020  . Beta 2 microglobuline, serum    Standing Status:   Future    Standing Expiration Date:   09/22/2020  . CBC with Differential    Standing Status:   Future    Standing Expiration Date:   09/22/2020  . Comprehensive metabolic panel    Standing Status:   Future    Standing Expiration Date:   09/22/2020    All questions were answered. The patient knows to call the clinic with any problems, questions or concerns.     Derek Jack, MD 09/23/2019 1:37 PM

## 2019-09-24 LAB — KAPPA/LAMBDA LIGHT CHAINS
Kappa free light chain: 106 mg/L — ABNORMAL HIGH (ref 3.3–19.4)
Kappa, lambda light chain ratio: 1.63 (ref 0.26–1.65)
Lambda free light chains: 65 mg/L — ABNORMAL HIGH (ref 5.7–26.3)

## 2019-09-24 LAB — BETA 2 MICROGLOBULIN, SERUM: Beta-2 Microglobulin: 5.8 mg/L — ABNORMAL HIGH (ref 0.6–2.4)

## 2019-09-25 LAB — PROTEIN ELECTROPHORESIS, SERUM
A/G Ratio: 1 (ref 0.7–1.7)
Albumin ELP: 3.5 g/dL (ref 2.9–4.4)
Alpha-1-Globulin: 0.3 g/dL (ref 0.0–0.4)
Alpha-2-Globulin: 0.8 g/dL (ref 0.4–1.0)
Beta Globulin: 1.3 g/dL (ref 0.7–1.3)
Gamma Globulin: 1 g/dL (ref 0.4–1.8)
Globulin, Total: 3.4 g/dL (ref 2.2–3.9)
Total Protein ELP: 6.9 g/dL (ref 6.0–8.5)

## 2019-09-29 LAB — IMMUNOFIXATION ELECTROPHORESIS
IgA: 451 mg/dL — ABNORMAL HIGH (ref 87–352)
IgG (Immunoglobin G), Serum: 1012 mg/dL (ref 586–1602)
IgM (Immunoglobulin M), Srm: 119 mg/dL (ref 26–217)
Total Protein ELP: 7 g/dL (ref 6.0–8.5)

## 2019-10-15 ENCOUNTER — Encounter (HOSPITAL_COMMUNITY): Payer: Self-pay | Admitting: Hematology

## 2019-10-15 ENCOUNTER — Inpatient Hospital Stay (HOSPITAL_COMMUNITY): Payer: Medicare HMO | Attending: Hematology | Admitting: Hematology

## 2019-10-15 ENCOUNTER — Other Ambulatory Visit: Payer: Self-pay

## 2019-10-15 VITALS — BP 176/71 | HR 79 | Temp 96.0°F | Resp 16 | Wt 183.5 lb

## 2019-10-15 DIAGNOSIS — E785 Hyperlipidemia, unspecified: Secondary | ICD-10-CM | POA: Insufficient documentation

## 2019-10-15 DIAGNOSIS — I10 Essential (primary) hypertension: Secondary | ICD-10-CM | POA: Insufficient documentation

## 2019-10-15 DIAGNOSIS — N184 Chronic kidney disease, stage 4 (severe): Secondary | ICD-10-CM | POA: Insufficient documentation

## 2019-10-15 DIAGNOSIS — E119 Type 2 diabetes mellitus without complications: Secondary | ICD-10-CM | POA: Diagnosis not present

## 2019-10-15 DIAGNOSIS — Z794 Long term (current) use of insulin: Secondary | ICD-10-CM | POA: Diagnosis not present

## 2019-10-15 DIAGNOSIS — Z79899 Other long term (current) drug therapy: Secondary | ICD-10-CM | POA: Insufficient documentation

## 2019-10-15 DIAGNOSIS — Z801 Family history of malignant neoplasm of trachea, bronchus and lung: Secondary | ICD-10-CM | POA: Insufficient documentation

## 2019-10-15 DIAGNOSIS — D696 Thrombocytopenia, unspecified: Secondary | ICD-10-CM | POA: Diagnosis not present

## 2019-10-15 DIAGNOSIS — D472 Monoclonal gammopathy: Secondary | ICD-10-CM | POA: Diagnosis not present

## 2019-10-15 NOTE — Assessment & Plan Note (Signed)
1. IgA kappa MGUS: -Patient seen at the request of Dr. Theador Hawthorne for further work-up of monoclonal gammopathy. -We discussed lab work from 09/23/2019. SPEP did not show any M spike. Kappa light chains are elevated at 106, lambda light chains 65 and ratio of 1.63. LDH was normal. Beta-2 microglobulin was 5.8. -Hemoglobin was 11.8. Creatinine was 1.86 and calcium of 9. -I have talked to her in detail about the normal pathophysiology of MGUS and prognosis. About 1% of patients per year go on to develop multiple myeloma. Hence it requires close monitoring. -I have recommended follow-up in 3 months with repeat labs and skeletal survey.  2. Mild thrombocytopenia: -Platelet count on 09/23/2019 was 123. A year ago it was 135 and 2 years ago it was 109. -Likely immune mediated versus early MDS. Will consider bone marrow biopsy if there is any significant change.  3. 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 -She has nonnephrotic range proteinuria. -Renal ultrasound on 06/25/2019 showed bilateral renal cortical atrophy without renal mass or hydronephrosis.  4. Diabetes: -She is on Glucotrol 10 mg daily and Soliqua. Hemoglobin A1c on 07/03/2019 was 6.6.  5. Hypertension: -Continue amlodipine 5 mg daily and losartan 25 mg daily.

## 2019-10-15 NOTE — Patient Instructions (Signed)
[place] at [facility] Discharge Instructions  You were seen today by Dr. Delton Coombes. He went over your recent test results. Your test results show that you have a condition called MGUS( monoclonal gammopathy of undetermined significance), which is an abnormal protein in your blood. This condition can transform into Myeloma which is a form of blood cancer. As long as your MGUS does not cause issues with your kidneys, bones and blood counts you will not need treatment. At this time you do not need any treatment for your condition. We will continue to monitor you through blood work and scans of your bones. He will see you back in 4 months for labs and follow up.   Thank you for choosing [place] at [facility] to provide your oncology and hematology care.  To afford each patient quality time with our provider, please arrive at least 15 minutes before your scheduled appointment time.   If you have a lab appointment with the [facility] please come in thru the  Main Entrance and check in at the main information desk  You need to re-schedule your appointment should you arrive 10 or more minutes late.  We strive to give you quality time with our providers, and arriving late affects you and other patients whose appointments are after yours.  Also, if you no show three or more times for appointments you may be dismissed from the clinic at the providers discretion.     Again, thank you for choosing [facility].  Our hope is that these requests will decrease the amount of time that you wait before being seen by our physicians.       _____________________________________________________________  Should you have questions after your visit to [facility], please contact our office at [phone] between the hours of 8:00 a.m. and 4:30 p.m.  Voicemails left after 4:00 p.m. will not be returned until the following business day.  For  prescription refill requests, have your pharmacy contact our office and allow 72 hours.    Cancer Center Support Programs:   > Cancer Support Group  2nd Tuesday of the month 1pm-2pm, Journey Room

## 2019-10-15 NOTE — Progress Notes (Signed)
Sarah Bridges, [place] [id number]   CLINIC:  Medical Oncology/Hematology  PCP:  Sarah Sill, NP [address] [place] [address] [phone]   REASON FOR VISIT:  Follow-up for MGUS.   CURRENT THERAPY: Observation.  INTERVAL HISTORY:  Sarah Bridges 69 y.o. female seen for follow-up of monoclonal gammopathy. Denies any new onset bone pains. Appetite and energy levels are 100%. Numbness in the feet has been stable. No fevers, night sweats or weight loss in the last 6 months.    REVIEW OF SYSTEMS:  Review of Systems  Neurological: Positive for numbness.  All other systems reviewed and are negative.    PAST MEDICAL/SURGICAL HISTORY:  Past Medical History:  Diagnosis Date  . Avulsion fracture    right foot  . DM (diabetes mellitus) ([place])    Type II  . Family history of adverse reaction to anesthesia    mother - N/V  . History of blood transfusion   . Hyperlipidemia   . Hypertension   . Peripheral neuropathy   . Vitamin D deficiency   . Wears glasses    Past Surgical History:  Procedure Laterality Date  . AMPUTATION TOE Right 07/17/2016   Procedure: AMPUTATION TOE right great and second toe;  Surgeon: Aviva Signs, MD;  Location: [facility];  Service: General;  Laterality: Right;  great and second toe, right  . CATARACT EXTRACTION W/PHACO Left 12/08/2016   Procedure: CATARACT EXTRACTION PHACO AND INTRAOCULAR LENS PLACEMENT (IOC);  Surgeon: Baruch Goldmann, MD;  Location: [facility];  Service: Ophthalmology;  Laterality: Left;  CDE: 7.51  . CATARACT EXTRACTION W/PHACO Right 12/22/2016   Procedure: CATARACT EXTRACTION PHACO AND INTRAOCULAR LENS PLACEMENT (IOC);  Surgeon: Baruch Goldmann, MD;  Location: [facility];  Service: Ophthalmology;  Laterality: Right;  CDE: 4.76  . COLONOSCOPY  01/17/2012   Procedure: COLONOSCOPY;  Surgeon: Daneil Dolin, MD;  Location: [facility];  Service: Endoscopy;  Laterality: N/A;  8:30  . HARDWARE REMOVAL  Right 08/21/2014   Procedure: Irrigation and Debridement Right Foot, Removal Fixation and Place Antibiotic Beads;  Surgeon: Newt Minion, MD;  Location: [facility];  Service: Orthopedics;  Laterality: Right;  . OPEN REDUCTION INTERNAL FIXATION (ORIF) FOOT LISFRANC FRACTURE Right 07/31/2014   Procedure: OPEN REDUCTION INTERNAL FIXATION (ORIF) FOOT NAVICULAR FRACTURE;  Surgeon: Newt Minion, MD;  Location: [facility];  Service: Orthopedics;  Laterality: Right;  . PARS PLANA VITRECTOMY 27 GAUGE Left 02/12/2018   Procedure: PARS PLANA VITRECTOMY 27 GAUGE ENDOLASER GAS/FLUID EXCHANGE AVASTIN INJECTION;  Surgeon: Hayden Pedro, MD;  Location: [facility];  Service: Ophthalmology;  Laterality: Left;  . piloninal cyst     age 89  . TONSILLECTOMY    . TUBAL LIGATION  1976     SOCIAL HISTORY:  Social History   Socioeconomic History  . Marital status: Married    Spouse name: Not on file  . Number of children: 2  . Years of education: Not on file  . Highest education level: Not on file  Occupational History  . Occupation: Control and instrumentation engineer    Comment: RETIRED  Tobacco Use  . Smoking status: Never Smoker  . Smokeless tobacco: Never Used  Substance and Sexual Activity  . Alcohol use: No  . Drug use: No  . Sexual activity: Not Currently  Other Topics Concern  . Not on file  Social History Narrative  . Not on file   Social Determinants of Health  Financial Resource Strain:   . Difficulty of Paying Living Expenses:   Food Insecurity:   . Worried About Charity fundraiser in the Last Year:   . Arboriculturist in the Last Year:   Transportation Needs:   . Film/video editor (Medical):   Marland Kitchen Lack of Transportation (Non-Medical):   Physical Activity:   . Days of Exercise per Week:   . Minutes of Exercise per Session:   Stress:   . Feeling of Stress :   Social Connections:   . Frequency of Communication with Friends and Family:   . Frequency of Social Gatherings with Friends and Family:   .  Attends Religious Services:   . Active Member of Clubs or Organizations:   . Attends Archivist Meetings:   Marland Kitchen Marital Status:   Intimate Partner Violence:   . Fear of Current or Ex-Partner:   . Emotionally Abused:   Marland Kitchen Physically Abused:   . Sexually Abused:     FAMILY HISTORY:  Family History  Problem Relation Age of Onset  . Emphysema Mother        deceased  . Heart murmur Brother   . Cancer - Other Maternal Grandmother   . Lung cancer Maternal Grandfather   . Liver disease Neg Hx   . Colon cancer Neg Hx     CURRENT MEDICATIONS:  Outpatient Encounter Medications as of 10/15/2019  Medication Sig  . amLODipine (NORVASC) 10 MG tablet Take 10 mg by mouth daily.  . Cholecalciferol (VITAMIN D) 2000 units tablet Take 2,000 Units by mouth every evening.   . ferrous sulfate 325 (65 FE) MG tablet Take 325 mg by mouth 2 (two) times daily.  Marland Kitchen glipiZIDE (GLUCOTROL) 10 MG tablet Take 10 mg by mouth daily.   . hydrALAZINE (APRESOLINE) 25 MG tablet Take 1 tablet (25 mg total) by mouth every 8 (eight) hours. (Patient taking differently: Take 25 mg by mouth 2 (two) times daily. )  . Insulin Glargine-Lixisenatide (SOLIQUA) 100-33 UNT-MCG/ML SOPN Inject into the skin.  Marland Kitchen losartan (COZAAR) 25 MG tablet Take 25 mg by mouth daily.  . simvastatin (ZOCOR) 20 MG tablet SMARTSIG:1 Tablet(s) By Mouth Every Evening  . [DISCONTINUED] amLODipine (NORVASC) 5 MG tablet Take 1 tablet (5 mg total) by mouth daily.   No facility-administered encounter medications on file as of 10/15/2019.    ALLERGIES:  No Known Allergies   PHYSICAL EXAM:  ECOG Performance status: 1  Vitals:   10/15/19 1600  BP: (!) 176/71  Pulse: 79  Resp: 16  Temp: (!) 96 F (35.6 C)  SpO2: 98%   Filed Weights   10/15/19 1600  Weight: 183 lb 8 oz (83.2 kg)   Physical Exam Vitals reviewed.  Constitutional:      Appearance: Normal appearance.  Cardiovascular:     Rate and Rhythm: Normal rate and regular rhythm.      Heart sounds: Normal heart sounds.  Pulmonary:     Effort: Pulmonary effort is normal.     Breath sounds: Normal breath sounds.  Abdominal:     General: There is no distension.     Palpations: Abdomen is soft. There is no mass.  Skin:    General: Skin is warm.  Neurological:     General: No focal deficit present.     Mental Status: She is alert and oriented to person, place, and time.  Psychiatric:        Mood and Affect: Mood normal.  Behavior: Behavior normal.      LABORATORY DATA:  I have reviewed the labs as listed.  CBC    Component Value Date/Time   WBC 7.0 09/23/2019 1348   RBC 3.86 (L) 09/23/2019 1348   HGB 11.8 (L) 09/23/2019 1348   HCT 34.8 (L) 09/23/2019 1348   PLT 123 (L) 09/23/2019 1348   MCV 90.2 09/23/2019 1348   MCH 30.6 09/23/2019 1348   MCHC 33.9 09/23/2019 1348   RDW 14.0 09/23/2019 1348   LYMPHSABS 1.3 09/23/2019 1348   MONOABS 0.4 09/23/2019 1348   EOSABS 0.2 09/23/2019 1348   BASOSABS 0.1 09/23/2019 1348   CMP Latest Ref Rng & Units 09/23/2019 02/12/2018 12/04/2016  Glucose 70 - 99 mg/dL 224(H) 210(H) 290(H)  BUN 8 - 23 mg/dL 24(H) 16 30(H)  Creatinine 0.44 - 1.00 mg/dL 1.86(H) 1.68(H) 1.72(H)  Sodium 135 - 145 mmol/L 137 137 135  Potassium 3.5 - 5.1 mmol/L 4.7 4.7 4.4  Chloride 98 - 111 mmol/L 107 106 103  CO2 22 - 32 mmol/L 21(L) 20(L) 24  Calcium 8.9 - 10.3 mg/dL 9.0 9.0 9.0  Total Protein 6.5 - 8.1 g/dL 7.6 - -  Total Bilirubin 0.3 - 1.2 mg/dL 0.7 - -  Alkaline Phos 38 - 126 U/L 72 - -  AST 15 - 41 U/L 18 - -  ALT 0 - 44 U/L 14 - -    DIAGNOSTIC IMAGING:  I have independently reviewed the scans and discussed with the patient.  ASSESSMENT & PLAN:  Monoclonal gammopathy 1. IgA kappa MGUS: -Patient seen at the request of Dr. Theador Hawthorne for further work-up of monoclonal gammopathy. -We discussed lab work from 09/23/2019. SPEP did not show any M spike. Kappa light chains are elevated at 106, lambda light chains 65 and ratio of 1.63.  LDH was normal. Beta-2 microglobulin was 5.8. -Hemoglobin was 11.8. Creatinine was 1.86 and calcium of 9. -I have talked to her in detail about the normal pathophysiology of MGUS and prognosis. About 1% of patients per year go on to develop multiple myeloma. Hence it requires close monitoring. -I have recommended follow-up in 3 months with repeat labs and skeletal survey.  2. Mild thrombocytopenia: -Platelet count on 09/23/2019 was 123. A year ago it was 135 and 2 years ago it was 109. -Likely immune mediated versus early MDS. Will consider bone marrow biopsy if there is any significant change.  3. 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 -She has nonnephrotic range proteinuria. -Renal ultrasound on 06/25/2019 showed bilateral renal cortical atrophy without renal mass or hydronephrosis.  4. Diabetes: -She is on Glucotrol 10 mg daily and Soliqua. Hemoglobin A1c on 07/03/2019 was 6.6.  5. Hypertension: -Continue amlodipine 5 mg daily and losartan 25 mg daily.     Orders placed this encounter:  Orders Placed This Encounter  Procedures  . DG Bone Survey Met  . Protein electrophoresis, serum  . Kappa/lambda light chains  . Lactate dehydrogenase  . CBC with Differential/Platelet  . Comprehensive metabolic panel      Derek Jack, MD [place] [phone]

## 2019-11-05 DIAGNOSIS — E1122 Type 2 diabetes mellitus with diabetic chronic kidney disease: Secondary | ICD-10-CM | POA: Diagnosis not present

## 2019-11-05 DIAGNOSIS — N189 Chronic kidney disease, unspecified: Secondary | ICD-10-CM | POA: Diagnosis not present

## 2019-11-05 DIAGNOSIS — I129 Hypertensive chronic kidney disease with stage 1 through stage 4 chronic kidney disease, or unspecified chronic kidney disease: Secondary | ICD-10-CM | POA: Diagnosis not present

## 2019-11-05 DIAGNOSIS — E1129 Type 2 diabetes mellitus with other diabetic kidney complication: Secondary | ICD-10-CM | POA: Diagnosis not present

## 2019-11-05 DIAGNOSIS — R809 Proteinuria, unspecified: Secondary | ICD-10-CM | POA: Diagnosis not present

## 2019-11-05 DIAGNOSIS — D638 Anemia in other chronic diseases classified elsewhere: Secondary | ICD-10-CM | POA: Diagnosis not present

## 2019-11-05 DIAGNOSIS — D472 Monoclonal gammopathy: Secondary | ICD-10-CM | POA: Diagnosis not present

## 2019-11-13 ENCOUNTER — Encounter (INDEPENDENT_AMBULATORY_CARE_PROVIDER_SITE_OTHER): Payer: Medicare HMO | Admitting: Ophthalmology

## 2019-11-13 ENCOUNTER — Other Ambulatory Visit: Payer: Self-pay

## 2019-11-13 DIAGNOSIS — E113592 Type 2 diabetes mellitus with proliferative diabetic retinopathy without macular edema, left eye: Secondary | ICD-10-CM

## 2019-11-13 DIAGNOSIS — H35033 Hypertensive retinopathy, bilateral: Secondary | ICD-10-CM

## 2019-11-13 DIAGNOSIS — H43813 Vitreous degeneration, bilateral: Secondary | ICD-10-CM | POA: Diagnosis not present

## 2019-11-13 DIAGNOSIS — E11311 Type 2 diabetes mellitus with unspecified diabetic retinopathy with macular edema: Secondary | ICD-10-CM | POA: Diagnosis not present

## 2019-11-13 DIAGNOSIS — E113311 Type 2 diabetes mellitus with moderate nonproliferative diabetic retinopathy with macular edema, right eye: Secondary | ICD-10-CM

## 2019-11-13 DIAGNOSIS — I10 Essential (primary) hypertension: Secondary | ICD-10-CM

## 2019-11-13 DIAGNOSIS — D3131 Benign neoplasm of right choroid: Secondary | ICD-10-CM | POA: Diagnosis not present

## 2019-11-20 DIAGNOSIS — G4700 Insomnia, unspecified: Secondary | ICD-10-CM | POA: Diagnosis not present

## 2019-11-20 DIAGNOSIS — N184 Chronic kidney disease, stage 4 (severe): Secondary | ICD-10-CM | POA: Diagnosis not present

## 2019-11-20 DIAGNOSIS — E782 Mixed hyperlipidemia: Secondary | ICD-10-CM | POA: Diagnosis not present

## 2019-11-20 DIAGNOSIS — I10 Essential (primary) hypertension: Secondary | ICD-10-CM | POA: Diagnosis not present

## 2019-11-20 DIAGNOSIS — E109 Type 1 diabetes mellitus without complications: Secondary | ICD-10-CM | POA: Diagnosis not present

## 2020-01-01 ENCOUNTER — Encounter (INDEPENDENT_AMBULATORY_CARE_PROVIDER_SITE_OTHER): Payer: Medicare HMO | Admitting: Ophthalmology

## 2020-01-02 ENCOUNTER — Other Ambulatory Visit: Payer: Self-pay

## 2020-01-02 ENCOUNTER — Encounter (INDEPENDENT_AMBULATORY_CARE_PROVIDER_SITE_OTHER): Payer: Medicare HMO | Admitting: Ophthalmology

## 2020-01-02 DIAGNOSIS — H43813 Vitreous degeneration, bilateral: Secondary | ICD-10-CM

## 2020-01-02 DIAGNOSIS — E11311 Type 2 diabetes mellitus with unspecified diabetic retinopathy with macular edema: Secondary | ICD-10-CM | POA: Diagnosis not present

## 2020-01-02 DIAGNOSIS — I10 Essential (primary) hypertension: Secondary | ICD-10-CM

## 2020-01-02 DIAGNOSIS — D3131 Benign neoplasm of right choroid: Secondary | ICD-10-CM | POA: Diagnosis not present

## 2020-01-02 DIAGNOSIS — E113592 Type 2 diabetes mellitus with proliferative diabetic retinopathy without macular edema, left eye: Secondary | ICD-10-CM

## 2020-01-02 DIAGNOSIS — E113311 Type 2 diabetes mellitus with moderate nonproliferative diabetic retinopathy with macular edema, right eye: Secondary | ICD-10-CM | POA: Diagnosis not present

## 2020-01-02 DIAGNOSIS — H35033 Hypertensive retinopathy, bilateral: Secondary | ICD-10-CM | POA: Diagnosis not present

## 2020-02-20 DIAGNOSIS — G4700 Insomnia, unspecified: Secondary | ICD-10-CM | POA: Diagnosis not present

## 2020-02-20 DIAGNOSIS — N184 Chronic kidney disease, stage 4 (severe): Secondary | ICD-10-CM | POA: Diagnosis not present

## 2020-02-20 DIAGNOSIS — E782 Mixed hyperlipidemia: Secondary | ICD-10-CM | POA: Diagnosis not present

## 2020-02-20 DIAGNOSIS — E109 Type 1 diabetes mellitus without complications: Secondary | ICD-10-CM | POA: Diagnosis not present

## 2020-02-20 DIAGNOSIS — I10 Essential (primary) hypertension: Secondary | ICD-10-CM | POA: Diagnosis not present

## 2020-02-25 ENCOUNTER — Inpatient Hospital Stay (HOSPITAL_COMMUNITY): Payer: Medicare HMO | Attending: Hematology

## 2020-02-27 ENCOUNTER — Encounter (INDEPENDENT_AMBULATORY_CARE_PROVIDER_SITE_OTHER): Payer: Medicare HMO | Admitting: Ophthalmology

## 2020-02-27 ENCOUNTER — Other Ambulatory Visit: Payer: Self-pay

## 2020-02-27 DIAGNOSIS — I10 Essential (primary) hypertension: Secondary | ICD-10-CM

## 2020-02-27 DIAGNOSIS — E113311 Type 2 diabetes mellitus with moderate nonproliferative diabetic retinopathy with macular edema, right eye: Secondary | ICD-10-CM | POA: Diagnosis not present

## 2020-02-27 DIAGNOSIS — H43813 Vitreous degeneration, bilateral: Secondary | ICD-10-CM | POA: Diagnosis not present

## 2020-02-27 DIAGNOSIS — D3131 Benign neoplasm of right choroid: Secondary | ICD-10-CM | POA: Diagnosis not present

## 2020-02-27 DIAGNOSIS — H35033 Hypertensive retinopathy, bilateral: Secondary | ICD-10-CM

## 2020-02-27 DIAGNOSIS — E113512 Type 2 diabetes mellitus with proliferative diabetic retinopathy with macular edema, left eye: Secondary | ICD-10-CM | POA: Diagnosis not present

## 2020-02-27 DIAGNOSIS — E11311 Type 2 diabetes mellitus with unspecified diabetic retinopathy with macular edema: Secondary | ICD-10-CM

## 2020-03-03 ENCOUNTER — Ambulatory Visit (HOSPITAL_COMMUNITY): Payer: Medicare HMO | Admitting: Hematology

## 2020-03-23 IMAGING — US US RENAL
1 series · 14 of 25 positions shown · non-contrast
Comparison: 07/29/2012

CLINICAL DATA: Chronic kidney disease, hypertension, diabetes
mellitus type II

EXAM:
RENAL / URINARY TRACT ULTRASOUND COMPLETE

[Series 1: us renal · 14 of 45 slices shown]
[im 1/45]
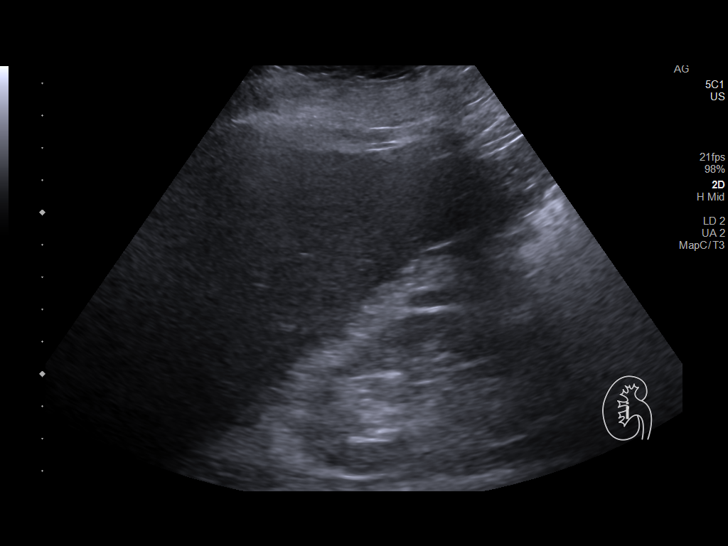
[im 4/45]
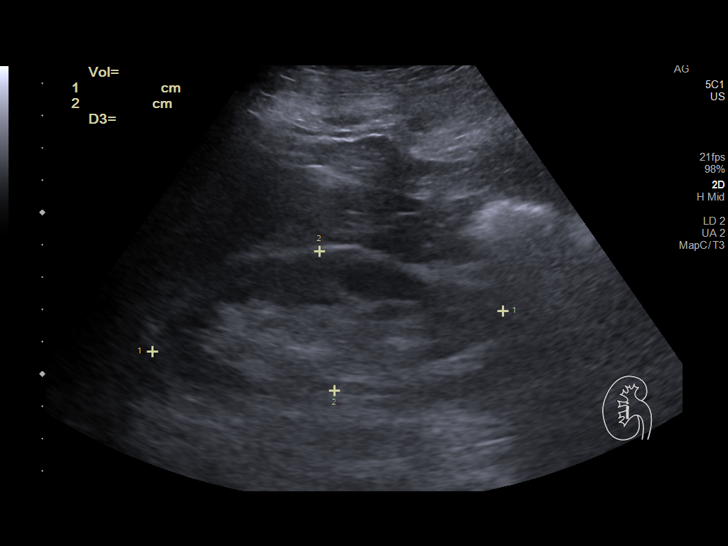
[im 8/45]
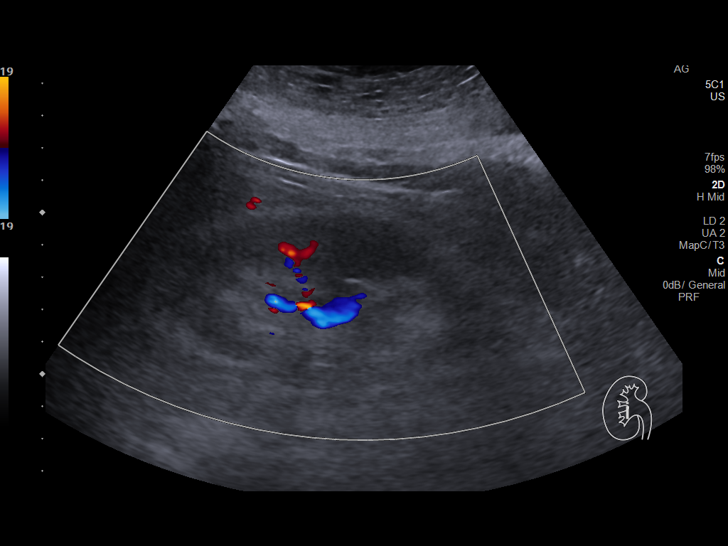
[im 12/45]
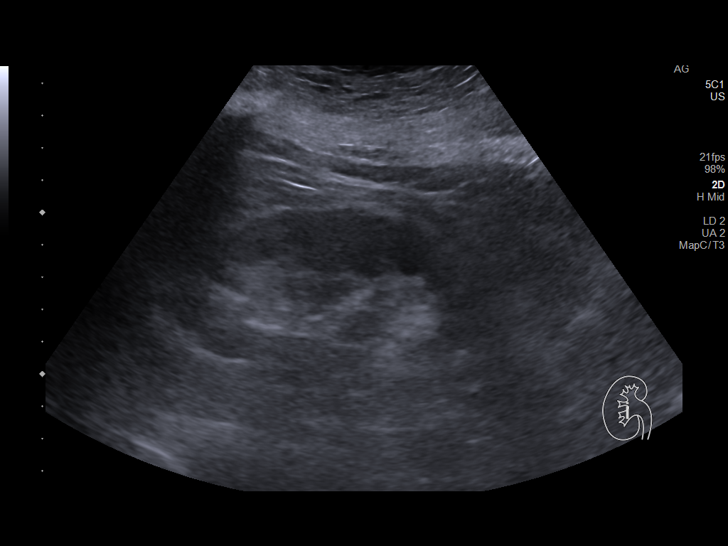
[im 15/45]
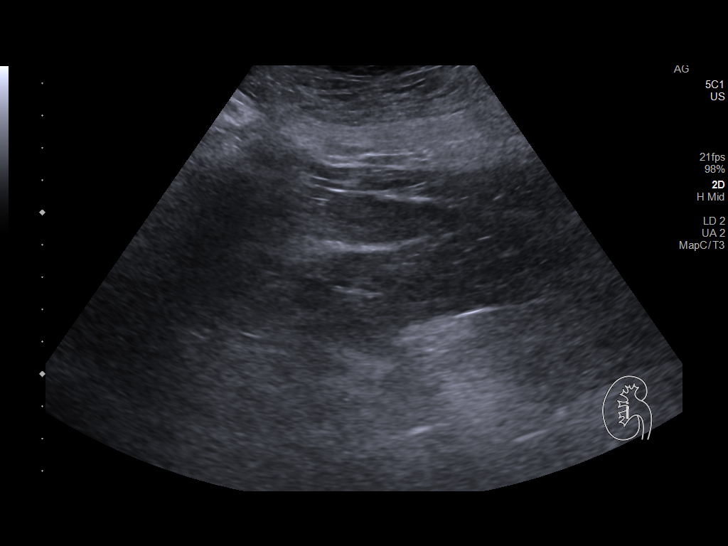
[im 17/45]
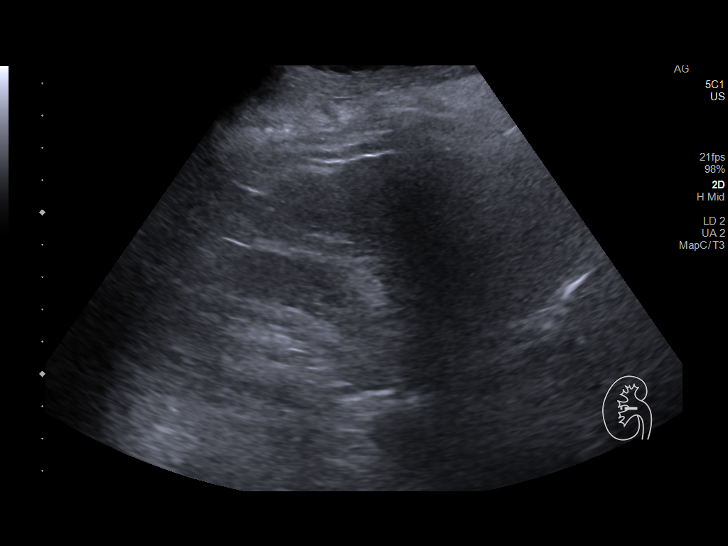
[im 21/45]
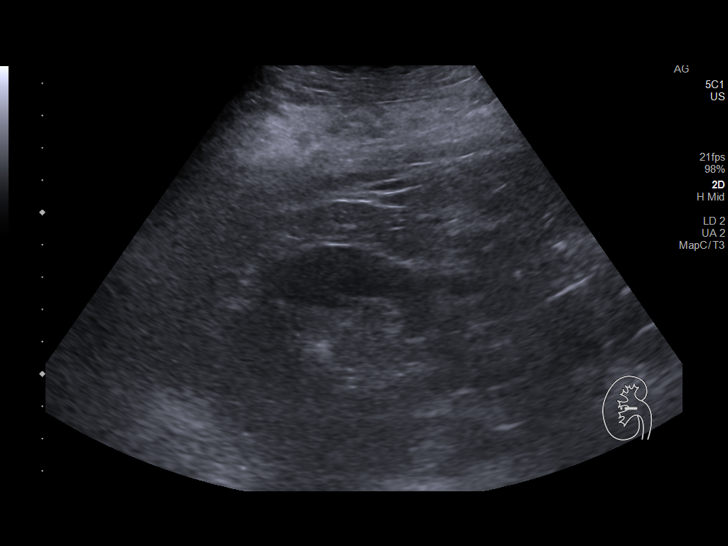
[im 24/45]
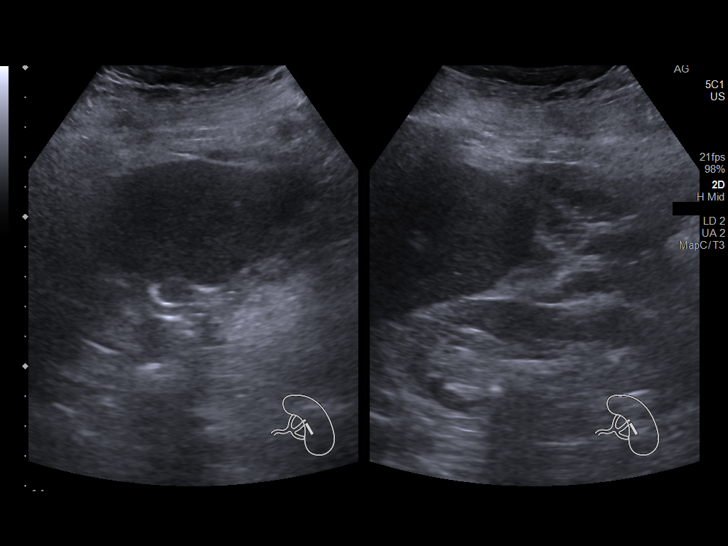
[im 28/45]
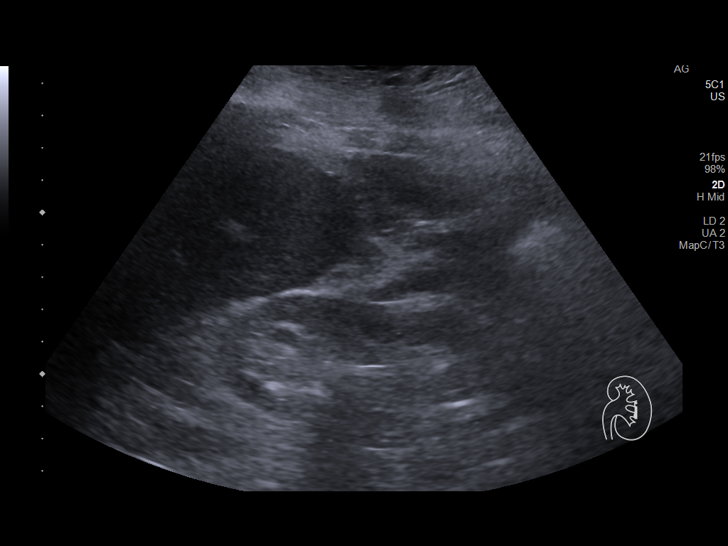
[im 30/45]
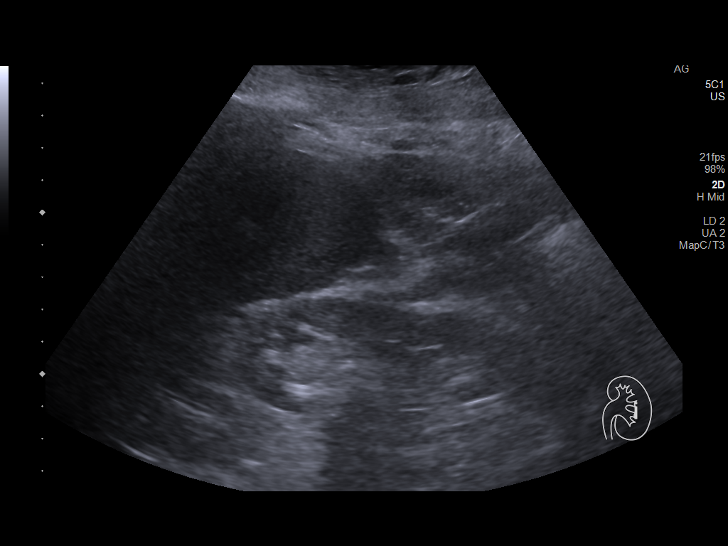
[im 34/45]
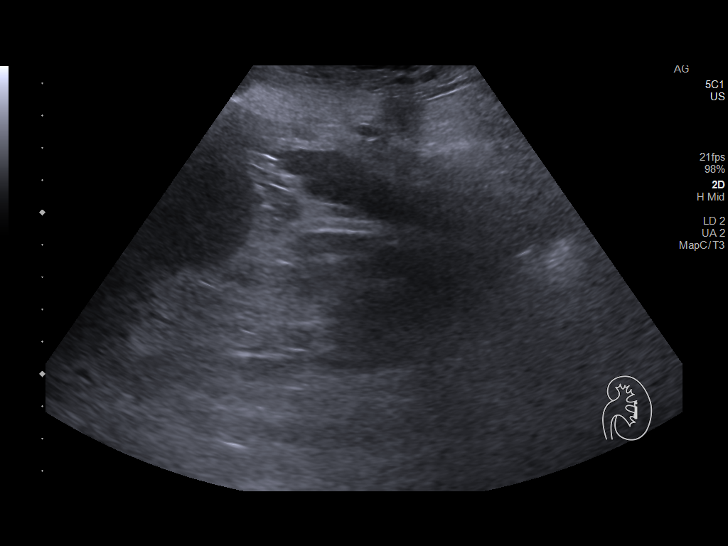
[im 37/45]
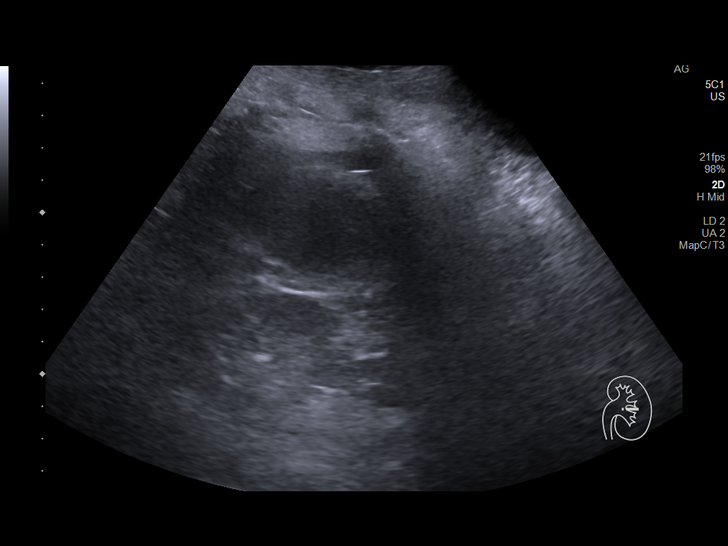
[im 41/45]
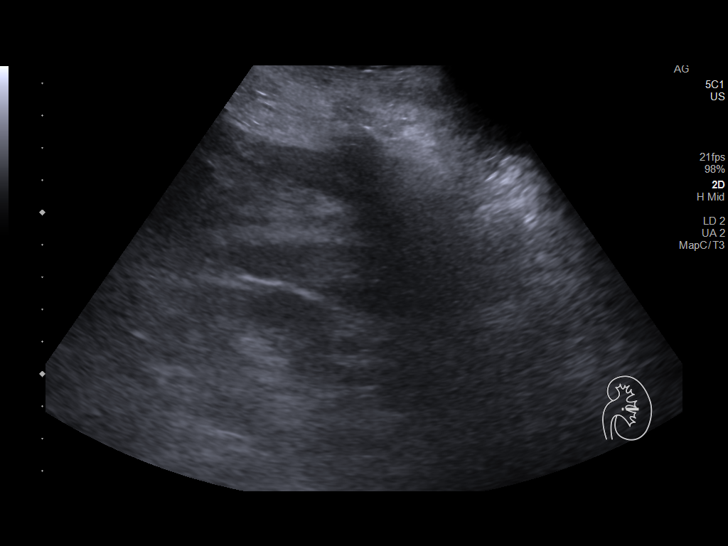
[im 45/45]
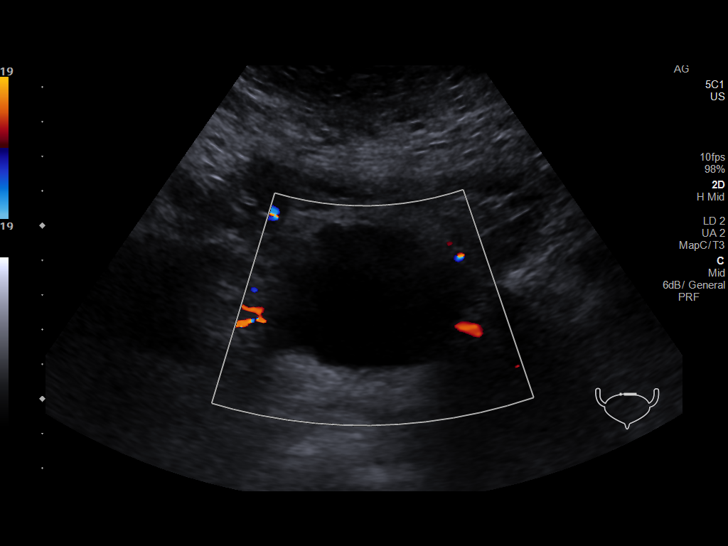

[14 of 25 positions shown; findings below may reference images not displayed]

FINDINGS: Right Kidney:

Renal measurements: 10.9 x 4.3 x 5.3 cm = volume: 132 mL. Cortical
thinning. Normal cortical echogenicity. No mass, hydronephrosis, or
shadowing calcification.

Left Kidney:

Renal measurements: 10.5 x 4.2 x 4.4 cm = volume: 100 mL. Cortical
thinning. Upper normal cortical echogenicity. No mass,
hydronephrosis or shadowing calcification.

Bladder:

Appears normal for degree of bladder distention.

Other:

N/A
IMPRESSION: BILATERAL renal cortical atrophy without renal mass or
hydronephrosis.

## 2020-04-23 ENCOUNTER — Other Ambulatory Visit: Payer: Self-pay

## 2020-04-23 ENCOUNTER — Encounter (INDEPENDENT_AMBULATORY_CARE_PROVIDER_SITE_OTHER): Payer: Medicare HMO | Admitting: Ophthalmology

## 2020-04-23 DIAGNOSIS — E113592 Type 2 diabetes mellitus with proliferative diabetic retinopathy without macular edema, left eye: Secondary | ICD-10-CM

## 2020-04-23 DIAGNOSIS — E113311 Type 2 diabetes mellitus with moderate nonproliferative diabetic retinopathy with macular edema, right eye: Secondary | ICD-10-CM | POA: Diagnosis not present

## 2020-04-23 DIAGNOSIS — E11311 Type 2 diabetes mellitus with unspecified diabetic retinopathy with macular edema: Secondary | ICD-10-CM

## 2020-04-23 DIAGNOSIS — I10 Essential (primary) hypertension: Secondary | ICD-10-CM | POA: Diagnosis not present

## 2020-04-23 DIAGNOSIS — H35033 Hypertensive retinopathy, bilateral: Secondary | ICD-10-CM

## 2020-04-23 DIAGNOSIS — D3131 Benign neoplasm of right choroid: Secondary | ICD-10-CM | POA: Diagnosis not present

## 2020-04-23 DIAGNOSIS — H43811 Vitreous degeneration, right eye: Secondary | ICD-10-CM | POA: Diagnosis not present

## 2020-05-21 DIAGNOSIS — G4700 Insomnia, unspecified: Secondary | ICD-10-CM | POA: Diagnosis not present

## 2020-05-21 DIAGNOSIS — I10 Essential (primary) hypertension: Secondary | ICD-10-CM | POA: Diagnosis not present

## 2020-05-21 DIAGNOSIS — N184 Chronic kidney disease, stage 4 (severe): Secondary | ICD-10-CM | POA: Diagnosis not present

## 2020-05-21 DIAGNOSIS — E782 Mixed hyperlipidemia: Secondary | ICD-10-CM | POA: Diagnosis not present

## 2020-05-21 DIAGNOSIS — E109 Type 1 diabetes mellitus without complications: Secondary | ICD-10-CM | POA: Diagnosis not present

## 2020-06-25 ENCOUNTER — Encounter (INDEPENDENT_AMBULATORY_CARE_PROVIDER_SITE_OTHER): Payer: Medicare HMO | Admitting: Ophthalmology

## 2020-06-29 ENCOUNTER — Encounter (INDEPENDENT_AMBULATORY_CARE_PROVIDER_SITE_OTHER): Payer: Medicare HMO | Admitting: Ophthalmology

## 2020-06-29 ENCOUNTER — Other Ambulatory Visit: Payer: Self-pay

## 2020-06-29 DIAGNOSIS — H35033 Hypertensive retinopathy, bilateral: Secondary | ICD-10-CM

## 2020-06-29 DIAGNOSIS — I10 Essential (primary) hypertension: Secondary | ICD-10-CM | POA: Diagnosis not present

## 2020-06-29 DIAGNOSIS — E113512 Type 2 diabetes mellitus with proliferative diabetic retinopathy with macular edema, left eye: Secondary | ICD-10-CM | POA: Diagnosis not present

## 2020-06-29 DIAGNOSIS — E113311 Type 2 diabetes mellitus with moderate nonproliferative diabetic retinopathy with macular edema, right eye: Secondary | ICD-10-CM | POA: Diagnosis not present

## 2020-06-29 DIAGNOSIS — D3131 Benign neoplasm of right choroid: Secondary | ICD-10-CM

## 2020-06-29 DIAGNOSIS — H43811 Vitreous degeneration, right eye: Secondary | ICD-10-CM

## 2020-08-20 ENCOUNTER — Other Ambulatory Visit: Payer: Self-pay

## 2020-08-20 ENCOUNTER — Encounter (INDEPENDENT_AMBULATORY_CARE_PROVIDER_SITE_OTHER): Payer: Medicare HMO | Admitting: Ophthalmology

## 2020-08-20 DIAGNOSIS — H43813 Vitreous degeneration, bilateral: Secondary | ICD-10-CM

## 2020-08-20 DIAGNOSIS — E113311 Type 2 diabetes mellitus with moderate nonproliferative diabetic retinopathy with macular edema, right eye: Secondary | ICD-10-CM

## 2020-08-20 DIAGNOSIS — H35033 Hypertensive retinopathy, bilateral: Secondary | ICD-10-CM

## 2020-08-20 DIAGNOSIS — E113512 Type 2 diabetes mellitus with proliferative diabetic retinopathy with macular edema, left eye: Secondary | ICD-10-CM

## 2020-08-20 DIAGNOSIS — I10 Essential (primary) hypertension: Secondary | ICD-10-CM | POA: Diagnosis not present

## 2020-08-20 DIAGNOSIS — D3131 Benign neoplasm of right choroid: Secondary | ICD-10-CM

## 2020-10-15 ENCOUNTER — Encounter (INDEPENDENT_AMBULATORY_CARE_PROVIDER_SITE_OTHER): Payer: Medicare HMO | Admitting: Ophthalmology

## 2020-10-15 ENCOUNTER — Other Ambulatory Visit: Payer: Self-pay

## 2020-10-15 DIAGNOSIS — E113592 Type 2 diabetes mellitus with proliferative diabetic retinopathy without macular edema, left eye: Secondary | ICD-10-CM

## 2020-10-15 DIAGNOSIS — D3131 Benign neoplasm of right choroid: Secondary | ICD-10-CM

## 2020-10-15 DIAGNOSIS — I10 Essential (primary) hypertension: Secondary | ICD-10-CM

## 2020-10-15 DIAGNOSIS — E113311 Type 2 diabetes mellitus with moderate nonproliferative diabetic retinopathy with macular edema, right eye: Secondary | ICD-10-CM

## 2020-10-15 DIAGNOSIS — H35033 Hypertensive retinopathy, bilateral: Secondary | ICD-10-CM | POA: Diagnosis not present

## 2020-10-15 DIAGNOSIS — H43813 Vitreous degeneration, bilateral: Secondary | ICD-10-CM

## 2020-12-10 ENCOUNTER — Other Ambulatory Visit: Payer: Self-pay

## 2020-12-10 ENCOUNTER — Encounter (INDEPENDENT_AMBULATORY_CARE_PROVIDER_SITE_OTHER): Payer: Medicare HMO | Admitting: Ophthalmology

## 2020-12-10 DIAGNOSIS — H35033 Hypertensive retinopathy, bilateral: Secondary | ICD-10-CM

## 2020-12-10 DIAGNOSIS — E113592 Type 2 diabetes mellitus with proliferative diabetic retinopathy without macular edema, left eye: Secondary | ICD-10-CM

## 2020-12-10 DIAGNOSIS — I10 Essential (primary) hypertension: Secondary | ICD-10-CM | POA: Diagnosis not present

## 2020-12-10 DIAGNOSIS — E113311 Type 2 diabetes mellitus with moderate nonproliferative diabetic retinopathy with macular edema, right eye: Secondary | ICD-10-CM | POA: Diagnosis not present

## 2020-12-10 DIAGNOSIS — D3131 Benign neoplasm of right choroid: Secondary | ICD-10-CM

## 2020-12-10 DIAGNOSIS — H43813 Vitreous degeneration, bilateral: Secondary | ICD-10-CM

## 2021-02-11 ENCOUNTER — Encounter (INDEPENDENT_AMBULATORY_CARE_PROVIDER_SITE_OTHER): Payer: Medicare HMO | Admitting: Ophthalmology

## 2021-02-24 ENCOUNTER — Other Ambulatory Visit: Payer: Self-pay

## 2021-02-24 ENCOUNTER — Encounter (INDEPENDENT_AMBULATORY_CARE_PROVIDER_SITE_OTHER): Payer: Medicare HMO | Admitting: Ophthalmology

## 2021-02-24 DIAGNOSIS — E113512 Type 2 diabetes mellitus with proliferative diabetic retinopathy with macular edema, left eye: Secondary | ICD-10-CM

## 2021-02-24 DIAGNOSIS — D3131 Benign neoplasm of right choroid: Secondary | ICD-10-CM

## 2021-02-24 DIAGNOSIS — H35033 Hypertensive retinopathy, bilateral: Secondary | ICD-10-CM | POA: Diagnosis not present

## 2021-02-24 DIAGNOSIS — H43813 Vitreous degeneration, bilateral: Secondary | ICD-10-CM

## 2021-02-24 DIAGNOSIS — I10 Essential (primary) hypertension: Secondary | ICD-10-CM | POA: Diagnosis not present

## 2021-02-24 DIAGNOSIS — E113311 Type 2 diabetes mellitus with moderate nonproliferative diabetic retinopathy with macular edema, right eye: Secondary | ICD-10-CM

## 2021-04-22 ENCOUNTER — Encounter (INDEPENDENT_AMBULATORY_CARE_PROVIDER_SITE_OTHER): Payer: Medicare HMO | Admitting: Ophthalmology

## 2021-04-22 ENCOUNTER — Other Ambulatory Visit: Payer: Self-pay

## 2021-04-22 DIAGNOSIS — H35033 Hypertensive retinopathy, bilateral: Secondary | ICD-10-CM

## 2021-04-22 DIAGNOSIS — H43813 Vitreous degeneration, bilateral: Secondary | ICD-10-CM

## 2021-04-22 DIAGNOSIS — E113512 Type 2 diabetes mellitus with proliferative diabetic retinopathy with macular edema, left eye: Secondary | ICD-10-CM

## 2021-04-22 DIAGNOSIS — E113311 Type 2 diabetes mellitus with moderate nonproliferative diabetic retinopathy with macular edema, right eye: Secondary | ICD-10-CM

## 2021-04-22 DIAGNOSIS — I10 Essential (primary) hypertension: Secondary | ICD-10-CM

## 2021-04-22 DIAGNOSIS — D3131 Benign neoplasm of right choroid: Secondary | ICD-10-CM

## 2021-06-17 ENCOUNTER — Encounter (INDEPENDENT_AMBULATORY_CARE_PROVIDER_SITE_OTHER): Payer: Medicare HMO | Admitting: Ophthalmology

## 2021-06-17 ENCOUNTER — Other Ambulatory Visit: Payer: Self-pay

## 2021-06-17 DIAGNOSIS — I10 Essential (primary) hypertension: Secondary | ICD-10-CM | POA: Diagnosis not present

## 2021-06-17 DIAGNOSIS — H35033 Hypertensive retinopathy, bilateral: Secondary | ICD-10-CM | POA: Diagnosis not present

## 2021-06-17 DIAGNOSIS — E113592 Type 2 diabetes mellitus with proliferative diabetic retinopathy without macular edema, left eye: Secondary | ICD-10-CM

## 2021-06-17 DIAGNOSIS — H43813 Vitreous degeneration, bilateral: Secondary | ICD-10-CM

## 2021-06-17 DIAGNOSIS — E113311 Type 2 diabetes mellitus with moderate nonproliferative diabetic retinopathy with macular edema, right eye: Secondary | ICD-10-CM

## 2021-08-26 ENCOUNTER — Other Ambulatory Visit: Payer: Self-pay

## 2021-08-26 ENCOUNTER — Encounter (INDEPENDENT_AMBULATORY_CARE_PROVIDER_SITE_OTHER): Payer: Medicare HMO | Admitting: Ophthalmology

## 2021-08-26 DIAGNOSIS — D3131 Benign neoplasm of right choroid: Secondary | ICD-10-CM

## 2021-08-26 DIAGNOSIS — H43812 Vitreous degeneration, left eye: Secondary | ICD-10-CM

## 2021-08-26 DIAGNOSIS — E113311 Type 2 diabetes mellitus with moderate nonproliferative diabetic retinopathy with macular edema, right eye: Secondary | ICD-10-CM

## 2021-08-26 DIAGNOSIS — H35033 Hypertensive retinopathy, bilateral: Secondary | ICD-10-CM

## 2021-08-26 DIAGNOSIS — I10 Essential (primary) hypertension: Secondary | ICD-10-CM | POA: Diagnosis not present

## 2021-08-26 DIAGNOSIS — E113512 Type 2 diabetes mellitus with proliferative diabetic retinopathy with macular edema, left eye: Secondary | ICD-10-CM | POA: Diagnosis not present

## 2021-11-04 ENCOUNTER — Encounter (INDEPENDENT_AMBULATORY_CARE_PROVIDER_SITE_OTHER): Payer: Medicare HMO | Admitting: Ophthalmology

## 2021-11-04 DIAGNOSIS — I10 Essential (primary) hypertension: Secondary | ICD-10-CM | POA: Diagnosis not present

## 2021-11-04 DIAGNOSIS — H35033 Hypertensive retinopathy, bilateral: Secondary | ICD-10-CM

## 2021-11-04 DIAGNOSIS — E113311 Type 2 diabetes mellitus with moderate nonproliferative diabetic retinopathy with macular edema, right eye: Secondary | ICD-10-CM | POA: Diagnosis not present

## 2021-11-04 DIAGNOSIS — E113512 Type 2 diabetes mellitus with proliferative diabetic retinopathy with macular edema, left eye: Secondary | ICD-10-CM | POA: Diagnosis not present

## 2021-11-04 DIAGNOSIS — H43813 Vitreous degeneration, bilateral: Secondary | ICD-10-CM | POA: Diagnosis not present

## 2022-01-20 ENCOUNTER — Encounter (INDEPENDENT_AMBULATORY_CARE_PROVIDER_SITE_OTHER): Payer: Medicare HMO | Admitting: Ophthalmology

## 2022-01-20 DIAGNOSIS — E113512 Type 2 diabetes mellitus with proliferative diabetic retinopathy with macular edema, left eye: Secondary | ICD-10-CM | POA: Diagnosis not present

## 2022-01-20 DIAGNOSIS — H43811 Vitreous degeneration, right eye: Secondary | ICD-10-CM

## 2022-01-20 DIAGNOSIS — I10 Essential (primary) hypertension: Secondary | ICD-10-CM | POA: Diagnosis not present

## 2022-01-20 DIAGNOSIS — E113311 Type 2 diabetes mellitus with moderate nonproliferative diabetic retinopathy with macular edema, right eye: Secondary | ICD-10-CM | POA: Diagnosis not present

## 2022-01-20 DIAGNOSIS — D3131 Benign neoplasm of right choroid: Secondary | ICD-10-CM

## 2022-01-20 DIAGNOSIS — H35033 Hypertensive retinopathy, bilateral: Secondary | ICD-10-CM | POA: Diagnosis not present

## 2022-03-24 ENCOUNTER — Encounter (INDEPENDENT_AMBULATORY_CARE_PROVIDER_SITE_OTHER): Payer: Medicare HMO | Admitting: Ophthalmology

## 2022-03-24 DIAGNOSIS — D3131 Benign neoplasm of right choroid: Secondary | ICD-10-CM

## 2022-03-24 DIAGNOSIS — H43811 Vitreous degeneration, right eye: Secondary | ICD-10-CM

## 2022-03-24 DIAGNOSIS — H35033 Hypertensive retinopathy, bilateral: Secondary | ICD-10-CM

## 2022-03-24 DIAGNOSIS — I10 Essential (primary) hypertension: Secondary | ICD-10-CM | POA: Diagnosis not present

## 2022-03-24 DIAGNOSIS — E113391 Type 2 diabetes mellitus with moderate nonproliferative diabetic retinopathy without macular edema, right eye: Secondary | ICD-10-CM

## 2022-03-24 DIAGNOSIS — E113592 Type 2 diabetes mellitus with proliferative diabetic retinopathy without macular edema, left eye: Secondary | ICD-10-CM | POA: Diagnosis not present

## 2022-03-31 ENCOUNTER — Encounter (INDEPENDENT_AMBULATORY_CARE_PROVIDER_SITE_OTHER): Payer: Medicare HMO | Admitting: Ophthalmology

## 2022-06-16 ENCOUNTER — Encounter (INDEPENDENT_AMBULATORY_CARE_PROVIDER_SITE_OTHER): Payer: Medicare HMO | Admitting: Ophthalmology

## 2022-06-16 DIAGNOSIS — I10 Essential (primary) hypertension: Secondary | ICD-10-CM

## 2022-06-16 DIAGNOSIS — H35033 Hypertensive retinopathy, bilateral: Secondary | ICD-10-CM

## 2022-06-16 DIAGNOSIS — E113391 Type 2 diabetes mellitus with moderate nonproliferative diabetic retinopathy without macular edema, right eye: Secondary | ICD-10-CM

## 2022-06-16 DIAGNOSIS — D3131 Benign neoplasm of right choroid: Secondary | ICD-10-CM

## 2022-06-16 DIAGNOSIS — E113592 Type 2 diabetes mellitus with proliferative diabetic retinopathy without macular edema, left eye: Secondary | ICD-10-CM

## 2022-06-16 DIAGNOSIS — H43811 Vitreous degeneration, right eye: Secondary | ICD-10-CM

## 2022-09-08 ENCOUNTER — Encounter (INDEPENDENT_AMBULATORY_CARE_PROVIDER_SITE_OTHER): Payer: Medicare HMO | Admitting: Ophthalmology

## 2022-09-22 ENCOUNTER — Encounter (INDEPENDENT_AMBULATORY_CARE_PROVIDER_SITE_OTHER): Payer: Medicare HMO | Admitting: Ophthalmology

## 2022-09-22 DIAGNOSIS — E113391 Type 2 diabetes mellitus with moderate nonproliferative diabetic retinopathy without macular edema, right eye: Secondary | ICD-10-CM | POA: Diagnosis not present

## 2022-09-22 DIAGNOSIS — H35033 Hypertensive retinopathy, bilateral: Secondary | ICD-10-CM

## 2022-09-22 DIAGNOSIS — D3131 Benign neoplasm of right choroid: Secondary | ICD-10-CM

## 2022-09-22 DIAGNOSIS — E113592 Type 2 diabetes mellitus with proliferative diabetic retinopathy without macular edema, left eye: Secondary | ICD-10-CM

## 2022-09-22 DIAGNOSIS — I10 Essential (primary) hypertension: Secondary | ICD-10-CM | POA: Diagnosis not present

## 2022-09-22 DIAGNOSIS — H43811 Vitreous degeneration, right eye: Secondary | ICD-10-CM

## 2022-12-15 ENCOUNTER — Encounter (INDEPENDENT_AMBULATORY_CARE_PROVIDER_SITE_OTHER): Payer: Medicare HMO | Admitting: Ophthalmology

## 2022-12-15 DIAGNOSIS — E113391 Type 2 diabetes mellitus with moderate nonproliferative diabetic retinopathy without macular edema, right eye: Secondary | ICD-10-CM | POA: Diagnosis not present

## 2022-12-15 DIAGNOSIS — E113592 Type 2 diabetes mellitus with proliferative diabetic retinopathy without macular edema, left eye: Secondary | ICD-10-CM | POA: Diagnosis not present

## 2022-12-15 DIAGNOSIS — Z7985 Long-term (current) use of injectable non-insulin antidiabetic drugs: Secondary | ICD-10-CM

## 2022-12-15 DIAGNOSIS — I10 Essential (primary) hypertension: Secondary | ICD-10-CM | POA: Diagnosis not present

## 2022-12-15 DIAGNOSIS — H43811 Vitreous degeneration, right eye: Secondary | ICD-10-CM

## 2022-12-15 DIAGNOSIS — Z7984 Long term (current) use of oral hypoglycemic drugs: Secondary | ICD-10-CM

## 2022-12-15 DIAGNOSIS — D3131 Benign neoplasm of right choroid: Secondary | ICD-10-CM

## 2022-12-15 DIAGNOSIS — H35033 Hypertensive retinopathy, bilateral: Secondary | ICD-10-CM

## 2023-04-06 ENCOUNTER — Encounter (INDEPENDENT_AMBULATORY_CARE_PROVIDER_SITE_OTHER): Payer: Medicare HMO | Admitting: Ophthalmology

## 2023-04-09 ENCOUNTER — Encounter (INDEPENDENT_AMBULATORY_CARE_PROVIDER_SITE_OTHER): Payer: Medicare HMO | Admitting: Ophthalmology

## 2023-04-09 DIAGNOSIS — D3131 Benign neoplasm of right choroid: Secondary | ICD-10-CM

## 2023-04-09 DIAGNOSIS — E113593 Type 2 diabetes mellitus with proliferative diabetic retinopathy without macular edema, bilateral: Secondary | ICD-10-CM

## 2023-04-09 DIAGNOSIS — Z7985 Long-term (current) use of injectable non-insulin antidiabetic drugs: Secondary | ICD-10-CM | POA: Diagnosis not present

## 2023-04-09 DIAGNOSIS — H4311 Vitreous hemorrhage, right eye: Secondary | ICD-10-CM | POA: Diagnosis not present

## 2023-04-09 DIAGNOSIS — H35033 Hypertensive retinopathy, bilateral: Secondary | ICD-10-CM

## 2023-04-09 DIAGNOSIS — I10 Essential (primary) hypertension: Secondary | ICD-10-CM

## 2023-04-09 DIAGNOSIS — Z7984 Long term (current) use of oral hypoglycemic drugs: Secondary | ICD-10-CM

## 2023-04-09 DIAGNOSIS — H43811 Vitreous degeneration, right eye: Secondary | ICD-10-CM

## 2023-04-23 ENCOUNTER — Encounter (INDEPENDENT_AMBULATORY_CARE_PROVIDER_SITE_OTHER): Payer: Medicare HMO | Admitting: Ophthalmology

## 2023-04-23 DIAGNOSIS — E113593 Type 2 diabetes mellitus with proliferative diabetic retinopathy without macular edema, bilateral: Secondary | ICD-10-CM | POA: Diagnosis not present

## 2023-04-23 DIAGNOSIS — Z7984 Long term (current) use of oral hypoglycemic drugs: Secondary | ICD-10-CM

## 2023-05-21 ENCOUNTER — Encounter (INDEPENDENT_AMBULATORY_CARE_PROVIDER_SITE_OTHER): Payer: Medicare HMO | Admitting: Ophthalmology

## 2023-05-21 DIAGNOSIS — E113593 Type 2 diabetes mellitus with proliferative diabetic retinopathy without macular edema, bilateral: Secondary | ICD-10-CM

## 2023-05-21 DIAGNOSIS — Z7984 Long term (current) use of oral hypoglycemic drugs: Secondary | ICD-10-CM | POA: Diagnosis not present

## 2023-05-21 DIAGNOSIS — H4311 Vitreous hemorrhage, right eye: Secondary | ICD-10-CM | POA: Diagnosis not present

## 2023-05-21 DIAGNOSIS — Z7985 Long-term (current) use of injectable non-insulin antidiabetic drugs: Secondary | ICD-10-CM | POA: Diagnosis not present

## 2023-05-21 DIAGNOSIS — H35033 Hypertensive retinopathy, bilateral: Secondary | ICD-10-CM

## 2023-05-21 DIAGNOSIS — H43811 Vitreous degeneration, right eye: Secondary | ICD-10-CM

## 2023-05-21 DIAGNOSIS — I10 Essential (primary) hypertension: Secondary | ICD-10-CM

## 2023-06-18 ENCOUNTER — Encounter (INDEPENDENT_AMBULATORY_CARE_PROVIDER_SITE_OTHER): Payer: Medicare PPO | Admitting: Ophthalmology

## 2023-06-18 DIAGNOSIS — I10 Essential (primary) hypertension: Secondary | ICD-10-CM | POA: Diagnosis not present

## 2023-06-18 DIAGNOSIS — Z7985 Long-term (current) use of injectable non-insulin antidiabetic drugs: Secondary | ICD-10-CM | POA: Diagnosis not present

## 2023-06-18 DIAGNOSIS — E113592 Type 2 diabetes mellitus with proliferative diabetic retinopathy without macular edema, left eye: Secondary | ICD-10-CM

## 2023-06-18 DIAGNOSIS — D3131 Benign neoplasm of right choroid: Secondary | ICD-10-CM

## 2023-06-18 DIAGNOSIS — E113511 Type 2 diabetes mellitus with proliferative diabetic retinopathy with macular edema, right eye: Secondary | ICD-10-CM | POA: Diagnosis not present

## 2023-06-18 DIAGNOSIS — H35033 Hypertensive retinopathy, bilateral: Secondary | ICD-10-CM

## 2023-07-16 ENCOUNTER — Encounter (INDEPENDENT_AMBULATORY_CARE_PROVIDER_SITE_OTHER): Payer: Medicare PPO | Admitting: Ophthalmology

## 2023-08-02 ENCOUNTER — Encounter (INDEPENDENT_AMBULATORY_CARE_PROVIDER_SITE_OTHER): Payer: Medicare PPO | Admitting: Ophthalmology

## 2023-08-02 DIAGNOSIS — H4311 Vitreous hemorrhage, right eye: Secondary | ICD-10-CM | POA: Diagnosis not present

## 2023-08-02 DIAGNOSIS — E113511 Type 2 diabetes mellitus with proliferative diabetic retinopathy with macular edema, right eye: Secondary | ICD-10-CM | POA: Diagnosis not present

## 2023-08-02 DIAGNOSIS — E113592 Type 2 diabetes mellitus with proliferative diabetic retinopathy without macular edema, left eye: Secondary | ICD-10-CM

## 2023-08-02 DIAGNOSIS — H43811 Vitreous degeneration, right eye: Secondary | ICD-10-CM

## 2023-08-02 DIAGNOSIS — Z7984 Long term (current) use of oral hypoglycemic drugs: Secondary | ICD-10-CM | POA: Diagnosis not present

## 2023-08-02 DIAGNOSIS — H35033 Hypertensive retinopathy, bilateral: Secondary | ICD-10-CM

## 2023-08-02 DIAGNOSIS — D3131 Benign neoplasm of right choroid: Secondary | ICD-10-CM

## 2023-08-02 DIAGNOSIS — I10 Essential (primary) hypertension: Secondary | ICD-10-CM

## 2023-08-02 DIAGNOSIS — Z7985 Long-term (current) use of injectable non-insulin antidiabetic drugs: Secondary | ICD-10-CM

## 2023-09-03 ENCOUNTER — Encounter (INDEPENDENT_AMBULATORY_CARE_PROVIDER_SITE_OTHER): Payer: Medicare PPO | Admitting: Ophthalmology

## 2023-09-07 ENCOUNTER — Encounter (INDEPENDENT_AMBULATORY_CARE_PROVIDER_SITE_OTHER): Admitting: Ophthalmology

## 2023-09-07 DIAGNOSIS — E113592 Type 2 diabetes mellitus with proliferative diabetic retinopathy without macular edema, left eye: Secondary | ICD-10-CM | POA: Diagnosis not present

## 2023-09-07 DIAGNOSIS — E113511 Type 2 diabetes mellitus with proliferative diabetic retinopathy with macular edema, right eye: Secondary | ICD-10-CM

## 2023-09-07 DIAGNOSIS — H4311 Vitreous hemorrhage, right eye: Secondary | ICD-10-CM | POA: Diagnosis not present

## 2023-09-07 DIAGNOSIS — Z7985 Long-term (current) use of injectable non-insulin antidiabetic drugs: Secondary | ICD-10-CM

## 2023-09-07 DIAGNOSIS — H43811 Vitreous degeneration, right eye: Secondary | ICD-10-CM

## 2023-09-07 DIAGNOSIS — Z7984 Long term (current) use of oral hypoglycemic drugs: Secondary | ICD-10-CM

## 2023-09-07 DIAGNOSIS — H35033 Hypertensive retinopathy, bilateral: Secondary | ICD-10-CM

## 2023-09-07 DIAGNOSIS — I10 Essential (primary) hypertension: Secondary | ICD-10-CM

## 2023-10-19 ENCOUNTER — Encounter (INDEPENDENT_AMBULATORY_CARE_PROVIDER_SITE_OTHER): Admitting: Ophthalmology

## 2023-10-19 DIAGNOSIS — E113592 Type 2 diabetes mellitus with proliferative diabetic retinopathy without macular edema, left eye: Secondary | ICD-10-CM | POA: Diagnosis not present

## 2023-10-19 DIAGNOSIS — Z7984 Long term (current) use of oral hypoglycemic drugs: Secondary | ICD-10-CM | POA: Diagnosis not present

## 2023-10-19 DIAGNOSIS — H43811 Vitreous degeneration, right eye: Secondary | ICD-10-CM

## 2023-10-19 DIAGNOSIS — Z7985 Long-term (current) use of injectable non-insulin antidiabetic drugs: Secondary | ICD-10-CM

## 2023-10-19 DIAGNOSIS — I10 Essential (primary) hypertension: Secondary | ICD-10-CM

## 2023-10-19 DIAGNOSIS — D3131 Benign neoplasm of right choroid: Secondary | ICD-10-CM

## 2023-10-19 DIAGNOSIS — E113511 Type 2 diabetes mellitus with proliferative diabetic retinopathy with macular edema, right eye: Secondary | ICD-10-CM

## 2023-10-19 DIAGNOSIS — H35033 Hypertensive retinopathy, bilateral: Secondary | ICD-10-CM

## 2023-11-30 ENCOUNTER — Encounter (INDEPENDENT_AMBULATORY_CARE_PROVIDER_SITE_OTHER): Admitting: Ophthalmology

## 2023-11-30 DIAGNOSIS — I10 Essential (primary) hypertension: Secondary | ICD-10-CM

## 2023-11-30 DIAGNOSIS — Z7984 Long term (current) use of oral hypoglycemic drugs: Secondary | ICD-10-CM | POA: Diagnosis not present

## 2023-11-30 DIAGNOSIS — E113511 Type 2 diabetes mellitus with proliferative diabetic retinopathy with macular edema, right eye: Secondary | ICD-10-CM | POA: Diagnosis not present

## 2023-11-30 DIAGNOSIS — Z7985 Long-term (current) use of injectable non-insulin antidiabetic drugs: Secondary | ICD-10-CM | POA: Diagnosis not present

## 2023-11-30 DIAGNOSIS — E113592 Type 2 diabetes mellitus with proliferative diabetic retinopathy without macular edema, left eye: Secondary | ICD-10-CM

## 2023-11-30 DIAGNOSIS — H35033 Hypertensive retinopathy, bilateral: Secondary | ICD-10-CM

## 2023-11-30 DIAGNOSIS — H43811 Vitreous degeneration, right eye: Secondary | ICD-10-CM

## 2024-01-17 ENCOUNTER — Encounter (INDEPENDENT_AMBULATORY_CARE_PROVIDER_SITE_OTHER): Admitting: Ophthalmology

## 2024-01-17 DIAGNOSIS — E113592 Type 2 diabetes mellitus with proliferative diabetic retinopathy without macular edema, left eye: Secondary | ICD-10-CM | POA: Diagnosis not present

## 2024-01-17 DIAGNOSIS — Z7984 Long term (current) use of oral hypoglycemic drugs: Secondary | ICD-10-CM | POA: Diagnosis not present

## 2024-01-17 DIAGNOSIS — I10 Essential (primary) hypertension: Secondary | ICD-10-CM

## 2024-01-17 DIAGNOSIS — D3131 Benign neoplasm of right choroid: Secondary | ICD-10-CM

## 2024-01-17 DIAGNOSIS — E113511 Type 2 diabetes mellitus with proliferative diabetic retinopathy with macular edema, right eye: Secondary | ICD-10-CM

## 2024-01-17 DIAGNOSIS — H43811 Vitreous degeneration, right eye: Secondary | ICD-10-CM

## 2024-01-17 DIAGNOSIS — Z7985 Long-term (current) use of injectable non-insulin antidiabetic drugs: Secondary | ICD-10-CM

## 2024-01-17 DIAGNOSIS — H35033 Hypertensive retinopathy, bilateral: Secondary | ICD-10-CM

## 2024-03-07 ENCOUNTER — Encounter (INDEPENDENT_AMBULATORY_CARE_PROVIDER_SITE_OTHER): Admitting: Ophthalmology

## 2024-03-07 DIAGNOSIS — Z7985 Long-term (current) use of injectable non-insulin antidiabetic drugs: Secondary | ICD-10-CM

## 2024-03-07 DIAGNOSIS — H35033 Hypertensive retinopathy, bilateral: Secondary | ICD-10-CM

## 2024-03-07 DIAGNOSIS — H43813 Vitreous degeneration, bilateral: Secondary | ICD-10-CM

## 2024-03-07 DIAGNOSIS — Z7984 Long term (current) use of oral hypoglycemic drugs: Secondary | ICD-10-CM

## 2024-03-07 DIAGNOSIS — I10 Essential (primary) hypertension: Secondary | ICD-10-CM

## 2024-03-07 DIAGNOSIS — E113593 Type 2 diabetes mellitus with proliferative diabetic retinopathy without macular edema, bilateral: Secondary | ICD-10-CM

## 2024-03-07 DIAGNOSIS — D3131 Benign neoplasm of right choroid: Secondary | ICD-10-CM

## 2024-03-07 DIAGNOSIS — H2512 Age-related nuclear cataract, left eye: Secondary | ICD-10-CM

## 2024-04-25 ENCOUNTER — Encounter (INDEPENDENT_AMBULATORY_CARE_PROVIDER_SITE_OTHER): Admitting: Ophthalmology

## 2024-04-25 DIAGNOSIS — D3131 Benign neoplasm of right choroid: Secondary | ICD-10-CM

## 2024-04-25 DIAGNOSIS — E113593 Type 2 diabetes mellitus with proliferative diabetic retinopathy without macular edema, bilateral: Secondary | ICD-10-CM | POA: Diagnosis not present

## 2024-04-25 DIAGNOSIS — I10 Essential (primary) hypertension: Secondary | ICD-10-CM | POA: Diagnosis not present

## 2024-04-25 DIAGNOSIS — H35033 Hypertensive retinopathy, bilateral: Secondary | ICD-10-CM

## 2024-04-25 DIAGNOSIS — Z7984 Long term (current) use of oral hypoglycemic drugs: Secondary | ICD-10-CM

## 2024-04-25 DIAGNOSIS — H43811 Vitreous degeneration, right eye: Secondary | ICD-10-CM

## 2024-04-25 DIAGNOSIS — Z7985 Long-term (current) use of injectable non-insulin antidiabetic drugs: Secondary | ICD-10-CM | POA: Diagnosis not present

## 2024-06-13 ENCOUNTER — Encounter (INDEPENDENT_AMBULATORY_CARE_PROVIDER_SITE_OTHER): Admitting: Ophthalmology

## 2024-06-13 DIAGNOSIS — Z7985 Long-term (current) use of injectable non-insulin antidiabetic drugs: Secondary | ICD-10-CM | POA: Diagnosis not present

## 2024-06-13 DIAGNOSIS — Z7984 Long term (current) use of oral hypoglycemic drugs: Secondary | ICD-10-CM

## 2024-06-13 DIAGNOSIS — D3131 Benign neoplasm of right choroid: Secondary | ICD-10-CM

## 2024-06-13 DIAGNOSIS — E113512 Type 2 diabetes mellitus with proliferative diabetic retinopathy with macular edema, left eye: Secondary | ICD-10-CM

## 2024-06-13 DIAGNOSIS — H43811 Vitreous degeneration, right eye: Secondary | ICD-10-CM

## 2024-06-13 DIAGNOSIS — H35033 Hypertensive retinopathy, bilateral: Secondary | ICD-10-CM | POA: Diagnosis not present

## 2024-06-13 DIAGNOSIS — I10 Essential (primary) hypertension: Secondary | ICD-10-CM | POA: Diagnosis not present

## 2024-06-13 DIAGNOSIS — E113591 Type 2 diabetes mellitus with proliferative diabetic retinopathy without macular edema, right eye: Secondary | ICD-10-CM | POA: Diagnosis not present

## 2024-08-01 ENCOUNTER — Encounter (INDEPENDENT_AMBULATORY_CARE_PROVIDER_SITE_OTHER): Admitting: Ophthalmology
